# Patient Record
Sex: Female | Born: 1985 | Race: Black or African American | Hispanic: No | Marital: Single | Smoking: Current every day smoker
Health system: Southern US, Community
[De-identification: ages and names within clinical notes are randomized; demographics above are authoritative.]

## PROBLEM LIST (undated history)

## (undated) DIAGNOSIS — N920 Excessive and frequent menstruation with regular cycle: Secondary | ICD-10-CM

## (undated) DIAGNOSIS — I10 Essential (primary) hypertension: Secondary | ICD-10-CM

## (undated) DIAGNOSIS — F329 Major depressive disorder, single episode, unspecified: Secondary | ICD-10-CM

## (undated) DIAGNOSIS — F319 Bipolar disorder, unspecified: Secondary | ICD-10-CM

## (undated) DIAGNOSIS — F419 Anxiety disorder, unspecified: Secondary | ICD-10-CM

## (undated) DIAGNOSIS — D649 Anemia, unspecified: Secondary | ICD-10-CM

## (undated) HISTORY — DX: Bipolar disorder, unspecified: F31.9

## (undated) HISTORY — DX: Major depressive disorder, single episode, unspecified: F32.9

## (undated) HISTORY — DX: Anemia, unspecified: D64.9

## (undated) HISTORY — DX: Essential (primary) hypertension: I10

## (undated) HISTORY — DX: Anxiety disorder, unspecified: F41.9

---

## 2007-11-28 ENCOUNTER — Ambulatory Visit: Payer: Self-pay | Admitting: Internal Medicine

## 2008-07-27 ENCOUNTER — Emergency Department: Payer: Self-pay | Admitting: Emergency Medicine

## 2009-03-13 ENCOUNTER — Emergency Department: Payer: Self-pay | Admitting: Emergency Medicine

## 2009-04-04 ENCOUNTER — Emergency Department: Payer: Self-pay | Admitting: Unknown Physician Specialty

## 2009-04-11 ENCOUNTER — Emergency Department: Payer: Self-pay | Admitting: Emergency Medicine

## 2009-04-28 ENCOUNTER — Emergency Department: Payer: Self-pay | Admitting: Internal Medicine

## 2009-10-02 ENCOUNTER — Inpatient Hospital Stay: Payer: Self-pay | Admitting: Psychiatry

## 2010-01-13 ENCOUNTER — Emergency Department: Payer: Self-pay | Admitting: Emergency Medicine

## 2010-01-17 ENCOUNTER — Emergency Department: Payer: Self-pay

## 2012-04-23 ENCOUNTER — Inpatient Hospital Stay: Payer: Self-pay

## 2012-04-23 LAB — PIH PROFILE
Anion Gap: 10 (ref 7–16)
BUN: 4 mg/dL — ABNORMAL LOW (ref 7–18)
Calcium, Total: 8.6 mg/dL (ref 8.5–10.1)
Co2: 23 mmol/L (ref 21–32)
EGFR (Non-African Amer.): >60
HGB: 12.1 g/dL (ref 12.0–16.0)
MCH: 24.3 pg — ABNORMAL LOW (ref 26.0–34.0)
Osmolality: 279 (ref 275–301)
Platelet: 110 10*3/uL — ABNORMAL LOW (ref 150–440)
Potassium: 3.6 mmol/L (ref 3.5–5.1)
RBC: 4.99 10*6/uL (ref 3.80–5.20)
SGOT(AST): 20 U/L (ref 15–37)
Sodium: 142 mmol/L (ref 136–145)
Uric Acid: 4.1 mg/dL (ref 2.6–6.0)

## 2012-04-23 LAB — PROTEIN / CREATININE RATIO, URINE: Protein/Creat. Ratio: 334 mg/gCREAT — ABNORMAL HIGH (ref 0–200)

## 2012-04-24 LAB — HEMATOCRIT: HCT: 30.6 % — ABNORMAL LOW (ref 35.0–47.0)

## 2013-02-12 ENCOUNTER — Emergency Department: Payer: Self-pay | Admitting: Emergency Medicine

## 2013-02-12 LAB — CBC
HCT: 38 % (ref 35.0–47.0)
HGB: 12.1 g/dL (ref 12.0–16.0)
MCH: 21.9 pg — ABNORMAL LOW (ref 26.0–34.0)
WBC: 6.5 10*3/uL (ref 3.6–11.0)

## 2013-02-12 LAB — URINALYSIS, COMPLETE
Bilirubin,UR: NEGATIVE
Glucose,UR: NEGATIVE mg/dL (ref 0–75)
Leukocyte Esterase: NEGATIVE
Nitrite: NEGATIVE
Ph: 5 (ref 4.5–8.0)
Specific Gravity: 1.026 (ref 1.003–1.030)
Squamous Epithelial: 5
WBC UR: 3 /HPF (ref 0–5)

## 2013-02-12 LAB — HCG, QUANTITATIVE, PREGNANCY: Beta Hcg, Quant.: 155954 m[IU]/mL — ABNORMAL HIGH

## 2013-02-12 LAB — GC/CHLAMYDIA PROBE AMP

## 2013-09-06 ENCOUNTER — Ambulatory Visit: Payer: Self-pay | Admitting: Obstetrics and Gynecology

## 2013-09-06 LAB — CBC WITH DIFFERENTIAL/PLATELET
Basophil #: 0 10*3/uL (ref 0.0–0.1)
Basophil %: 0.5 %
HCT: 34.9 % — ABNORMAL LOW (ref 35.0–47.0)
HGB: 11.2 g/dL — ABNORMAL LOW (ref 12.0–16.0)
Lymphocyte %: 22.9 %
MCHC: 32.2 g/dL (ref 32.0–36.0)
Neutrophil #: 4.9 10*3/uL (ref 1.4–6.5)
Platelet: 121 10*3/uL — ABNORMAL LOW (ref 150–440)
RDW: 14.4 % (ref 11.5–14.5)
WBC: 6.8 10*3/uL (ref 3.6–11.0)

## 2013-09-08 ENCOUNTER — Inpatient Hospital Stay: Payer: Self-pay | Admitting: Obstetrics and Gynecology

## 2014-12-10 ENCOUNTER — Emergency Department: Payer: Self-pay | Admitting: Emergency Medicine

## 2014-12-10 LAB — CBC
HCT: 33.4 % — ABNORMAL LOW (ref 35.0–47.0)
HGB: 10.5 g/dL — ABNORMAL LOW (ref 12.0–16.0)
MCH: 22.6 pg — AB (ref 26.0–34.0)
MCHC: 31.3 g/dL — AB (ref 32.0–36.0)
MCV: 72 fL — ABNORMAL LOW (ref 80–100)
Platelet: 144 10*3/uL — ABNORMAL LOW (ref 150–440)
RBC: 4.63 10*6/uL (ref 3.80–5.20)
RDW: 14.7 % — AB (ref 11.5–14.5)
WBC: 8.1 10*3/uL (ref 3.6–11.0)

## 2014-12-10 LAB — COMPREHENSIVE METABOLIC PANEL
ALT: 11 U/L — AB
AST: 18 U/L
Albumin: 3 g/dL — ABNORMAL LOW
Alkaline Phosphatase: 115 U/L
Anion Gap: 9 (ref 7–16)
BUN: 9 mg/dL
Bilirubin,Total: 0.6 mg/dL
CALCIUM: 8.7 mg/dL — AB
CHLORIDE: 105 mmol/L
Co2: 21 mmol/L — ABNORMAL LOW
Creatinine: 0.54 mg/dL
EGFR (African American): >60
EGFR (Non-African Amer.): >60
GLUCOSE: 121 mg/dL — AB
Potassium: 3.5 mmol/L
Sodium: 135 mmol/L
Total Protein: 7.1 g/dL

## 2014-12-10 LAB — TROPONIN I: Troponin-I: <0.03 ng/mL

## 2014-12-10 LAB — D-DIMER(ARMC): D-DIMER: 1037 ng/mL

## 2015-01-01 NOTE — Op Note (Signed)
PATIENT NAME:  Terri Bruce, Terri Bruce MR#:  [id number] DATE OF BIRTH:  01-18-1986  DATE OF PROCEDURE:  04/23/2012  PREOPERATIVE DIAGNOSIS: Secondary arrest of dilation.   POSTOPERATIVE DIAGNOSIS: Secondary arrest of dilation.   PROCEDURE: Primary LUT cesarean section.   SURGEON: Delsa Sale, MD  ASSISTANT: Burlene Arnt, CNM  ESTIMATED BLOOD LOSS: 1000 mL.   FINDINGS: Baby girl weighing 8 pounds 2 ounces with Apgars 4 and 9.   DESCRIPTION OF PROCEDURE: Patient was taken to the Operating Room and placed in supine position. After adequate spinal and epidural anesthesia was instilled, the patient was prepped and draped in the usual sterile fashion. see next dictaion of complete  (dictation cut off here)  ____________________________ Delsa Sale, MD cck:cms D: 04/26/2012 08:14:29 ET T: 04/26/2012 11:03:41 ET JOB#: [id number]  cc: Delsa Sale, MD, <Dictator> Delsa Sale MD ELECTRONICALLY SIGNED 04/26/2012 20:04

## 2015-01-01 NOTE — Op Note (Signed)
PATIENT NAME:  Terri Bruce, Terri Bruce MR#:  [id number] DATE OF BIRTH:  01-24-1986  DATE OF PROCEDURE:  04/23/2012  PREOPERATIVE DIAGNOSIS: Secondary arrest of dilation.  POSTOPERATIVE DIAGNOSIS: Secondary arrest of dilation.   PROCEDURE: LUT cesarean section   SURGEON: Delsa Sale, MD  ASSISTANT: Tammy Brothers, CNM   ESTIMATED BLOOD LOSS: 1000 mL.  FINDINGS: 8 pounds 2 ounce liveborn female infant, Apgars 4 and 9 with baby secondarily apneic.   DESCRIPTION OF PROCEDURE: Patient was taken to the Operating Room and placed in supine position. After adequate epidural anesthesia was instilled, the patient was prepped and draped in the usual sterile fashion. Timeout was performed. Pfannenstiel skin incision was made approximately two fingerbreadths above the pubic symphysis, carried sharply down to the fascia. Fascia was nicked in the midline and the incision was extended to the fascia. The incision was extended in a superolateral manner with curved Mayo scissors. Kochers were used to grasp the fascia. Fascia muscle bellies were sharply and bluntly dissected off the inferior and the superior rectus muscles defined by incision. Muscle belly midline was identified. Center was entered. Peritoneum was grasped and incision was extended. The bladder blade was placed. Bladder flap was created. Bladder blade was replaced. Uterine incision was made. Infant's head was delivered and found to be  very molded. The infant was bulb suctioned. Remainder of the infant was delivered. Infant was bulb suctioned. Cord was clamped and cut. Infant was handed off to the pediatrician. Cord blood was obtained. Cord was clamped secondarily. Placenta was delivered. Pitocin was started. Uterus was wrapped in a moist laparotomy sponge and the anterior was curetted with a moist lap sponge. Cord gas was obtained as it turns out that the child was secondarily apneic after delivery. Uterine incision was closed with a running locked chromic  suture, secondary running embrocating suture was placed. Bladder flap was tacked back to uterine incision. The belly was cleared of clot. Uterus was placed back into the abdomen. Gutter clots were removed. Kellys were used to grasp the peritoneum and the muscle bellies were approximated with a running Vicryl suture. The On-Q pain pump was then inserted, trocars through the superior rectus fascia. The fascial incision was closed. 4-0 Monocryl was used to close the skin incision. The On-Q pump was primed. Steri-Strips and benzoin were placed on the incision. 4 x 4 and Tegaderm was placed on the abdomen underneath the catheters of the On-Q pain pump. Bandage was placed. Uterus was massaged and found to be firm. Clear urine was noted in the Foley bag and the patient was taken to recovery after having tolerated the procedure well.   ____________________________ Delsa Sale, MD cck:cms D: 04/26/2012 08:21:16 ET T: 04/26/2012 11:07:17 ET JOB#: [id number]  cc: Delsa Sale, MD, <Dictator> Delsa Sale MD ELECTRONICALLY SIGNED 04/26/2012 20:05

## 2015-01-05 NOTE — Op Note (Signed)
PATIENT NAME:  Terri Bruce, Terri Bruce MR#:  [id number] DATE OF BIRTH:  1986/03/12  DATE OF PROCEDURE:  09/13/2013  PREOPERATIVE DIAGNOSES: Repeat cesarean section. Previous C-section.  POSTOPERATIVE DIAGNOSES:   Repeat cesarean section. Previous C-section.  PROCEDURES: Repeat lower uterine segment transverse cesarean section.   SURGEON:  Delsa Sale, M.D.  ASSISTANT: (Dictation Anomaly) .  ESTIMATED BLOOD LOSS: 1000 mL.   FINDINGS: Term liveborn infant with normal uterus, tubes and ovaries except for one large fibroid at the right posterior aspect near the uterocervical junction, approximately 4 cm, and half in the muscle, half in the exterior.   DESCRIPTION OF PROCEDURE: The patient was taken to the Operating Room and placed in supine position. After adequate spinal anesthesia was instilled, the patient was prepped and draped in the usual sterile fashion. Pfannenstiel skin incision was made around her old skin incision. The old incision was cut out as it had become keloid. __the fascia___ was nicked in the midline and carried sharply down to the fascia. The fascia was extended in a superolateral manner with the curved Mayo scissors. Kochers were used to grasp the rectus fascia which was sharply and bluntly dissected off the rectus muscles. Midlines of the muscles were identified and split. Peritoneum was grasped, cut, entered and bladder blade was placed. A bladder flap was created. Uterine incision was made and extended with the surgeon's fingers. Infant's head was found, delivered, infant's mouth was bulb suctioned. Remainder of the infant was delivered. Cord was clamped, cut. The infant was handed off to the awaiting pediatrician. Cord blood was obtained, Pitocin was started, the placenta delivered.  The uterus was exteriorized and wrapped with a moist laparotomy sponge.  The interior of the uterus was curetted. Uterine incision was closed with a running locked (Dictation Anomaly)chromic  suture and  then a running imbricating suture. The bladder was tacked back up to the incision with a 3-0 chromic. The belly was cleared of clots. The uterus was placed back, (Dictation Anomaly)the muscle bellies  were closed and approximated with Vicryl suture. ON-Q pain pump trocars were placed. The catheters were threaded. They are placed in between the muscle and fascia. The fascia was closed with a running Vicryl suture. The pump was primed. Skin clips were placed on the skin edges. Bandage was placed. ON-Q pain pump catheters were attached to the patient's abdomen with 4 x 4's. Uterus was then assessed. (Dictation Anomaly)and was a firm uterus. The patient was then taken to recovery with clear urine noted in the Foley bag.   ____________________________ Delsa Sale, MD cck:sg D: 09/13/2013 09:38:24 ET T: 09/13/2013 10:45:50 ET JOB#: [id number]  cc: Delsa Sale, MD, <Dictator> Delsa Sale MD ELECTRONICALLY SIGNED 09/14/2013 14:39

## 2015-01-07 ENCOUNTER — Inpatient Hospital Stay
Admit: 2015-01-07 | Discharge: 2015-01-12 | Disposition: A | Discharge status: Home / Self Care | Payer: Self-pay | Source: Ambulatory Visit | Attending: Obstetrics and Gynecology | Admitting: Obstetrics and Gynecology

## 2015-01-07 LAB — CBC WITH DIFFERENTIAL/PLATELET
BASOS ABS: 0 10*3/uL (ref 0.0–0.1)
Basophil %: 0.3 %
Eosinophil #: 0 10*3/uL (ref 0.0–0.7)
Eosinophil %: 0.6 %
HCT: 34.1 % — ABNORMAL LOW (ref 35.0–47.0)
HGB: 10.8 g/dL — ABNORMAL LOW (ref 12.0–16.0)
LYMPHS PCT: 16 %
Lymphocyte #: 1.3 10*3/uL (ref 1.0–3.6)
MCH: 22.9 pg — ABNORMAL LOW (ref 26.0–34.0)
MCHC: 31.8 g/dL — AB (ref 32.0–36.0)
MCV: 72 fL — ABNORMAL LOW (ref 80–100)
MONOS PCT: 10.1 %
Monocyte #: 0.8 x10 3/mm (ref 0.2–0.9)
NEUTROS PCT: 73 %
Neutrophil #: 5.9 10*3/uL (ref 1.4–6.5)
PLATELETS: 132 10*3/uL — AB (ref 150–440)
RBC: 4.72 10*6/uL (ref 3.80–5.20)
RDW: 16 % — ABNORMAL HIGH (ref 11.5–14.5)
WBC: 8.1 10*3/uL (ref 3.6–11.0)

## 2015-01-07 LAB — COMPREHENSIVE METABOLIC PANEL
ANION GAP: 8 (ref 7–16)
AST: 21 U/L
Albumin: 2.8 g/dL — ABNORMAL LOW
Alkaline Phosphatase: 122 U/L
BUN: 7 mg/dL
Bilirubin,Total: 0.3 mg/dL
CALCIUM: 8.6 mg/dL — AB
CO2: 22 mmol/L
Chloride: 107 mmol/L
Creatinine: 0.49 mg/dL
EGFR (Non-African Amer.): >60
GLUCOSE: 78 mg/dL
Potassium: 3.3 mmol/L — ABNORMAL LOW
SGPT (ALT): 15 U/L
SODIUM: 137 mmol/L
Total Protein: 6.6 g/dL

## 2015-01-07 LAB — PROTEIN / CREATININE RATIO, URINE
CREATININE, URINE RANDOM: 114 mg/dL (ref 30–125)
PROTEIN/CREAT. RATIO: 175 mg/g{creat} (ref 0–200)
Protein, Urine: 20 mg/dL (ref 0–9)

## 2015-01-08 LAB — CBC WITH DIFFERENTIAL/PLATELET
BASOS PCT: 0.3 %
BASOS PCT: 0.3 %
BASOS PCT: 0.4 %
Basophil #: 0 10*3/uL (ref 0.0–0.1)
Basophil #: 0 10*3/uL (ref 0.0–0.1)
Basophil #: 0 10*3/uL (ref 0.0–0.1)
EOS ABS: 0 10*3/uL (ref 0.0–0.7)
EOS ABS: 0 10*3/uL (ref 0.0–0.7)
EOS PCT: 0.8 %
EOS PCT: 0.5 %
Eosinophil #: 0.1 10*3/uL (ref 0.0–0.7)
Eosinophil %: 0.2 %
HCT: 33.6 % — AB (ref 35.0–47.0)
HCT: 35.5 % (ref 35.0–47.0)
HCT: 36.7 % (ref 35.0–47.0)
HGB: 10.2 g/dL — AB (ref 12.0–16.0)
HGB: 11.1 g/dL — AB (ref 12.0–16.0)
HGB: 11.4 g/dL — ABNORMAL LOW (ref 12.0–16.0)
LYMPHS ABS: 1 10*3/uL (ref 1.0–3.6)
Lymphocyte #: 1.4 10*3/uL (ref 1.0–3.6)
Lymphocyte #: 1 10*3/uL (ref 1.0–3.6)
Lymphocyte %: 18.8 %
Lymphocyte %: 14.2 %
Lymphocyte %: 9 %
MCH: 22.4 pg — ABNORMAL LOW (ref 26.0–34.0)
MCH: 23.1 pg — ABNORMAL LOW (ref 26.0–34.0)
MCH: 22.9 pg — AB (ref 26.0–34.0)
MCHC: 30.4 g/dL — AB (ref 32.0–36.0)
MCHC: 31.4 g/dL — AB (ref 32.0–36.0)
MCHC: 31 g/dL — ABNORMAL LOW (ref 32.0–36.0)
MCV: 74 fL — ABNORMAL LOW (ref 80–100)
MCV: 74 fL — ABNORMAL LOW (ref 80–100)
MCV: 74 fL — AB (ref 80–100)
MONO ABS: 0.8 x10 3/mm (ref 0.2–0.9)
MONO ABS: 0.6 x10 3/mm (ref 0.2–0.9)
Monocyte #: 0.4 x10 3/mm (ref 0.2–0.9)
Monocyte %: 11.2 %
Monocyte %: 5.2 %
Monocyte %: 5.6 %
NEUTROS ABS: 9 10*3/uL — AB (ref 1.4–6.5)
NEUTROS PCT: 79.8 %
Neutrophil #: 5 10*3/uL (ref 1.4–6.5)
Neutrophil #: 5.5 10*3/uL (ref 1.4–6.5)
Neutrophil %: 68.9 %
Neutrophil %: 84.8 %
PLATELETS: 122 10*3/uL — AB (ref 150–440)
Platelet: 127 10*3/uL — ABNORMAL LOW (ref 150–440)
Platelet: 134 10*3/uL — ABNORMAL LOW (ref 150–440)
RBC: 4.56 10*6/uL (ref 3.80–5.20)
RBC: 4.83 10*6/uL (ref 3.80–5.20)
RBC: 4.98 10*6/uL (ref 3.80–5.20)
RDW: 15.7 % — ABNORMAL HIGH (ref 11.5–14.5)
RDW: 16.2 % — ABNORMAL HIGH (ref 11.5–14.5)
RDW: 16.5 % — AB (ref 11.5–14.5)
WBC: 7.3 10*3/uL (ref 3.6–11.0)
WBC: 6.9 10*3/uL (ref 3.6–11.0)
WBC: 10.6 10*3/uL (ref 3.6–11.0)

## 2015-01-08 LAB — APTT: ACTIVATED PTT: 25.7 s (ref 23.6–35.9)

## 2015-01-08 LAB — PROTIME-INR
INR: 1
PROTHROMBIN TIME: 13.5 s

## 2015-01-08 LAB — FIBRINOGEN: Fibrinogen: 472 mg/dL — ABNORMAL HIGH (ref 210–470)

## 2015-01-09 LAB — CBC WITH DIFFERENTIAL/PLATELET
BASOS ABS: 0 10*3/uL (ref 0.0–0.1)
Basophil #: 0 10*3/uL (ref 0.0–0.1)
Basophil %: 0.3 %
Basophil %: 0.4 %
Eosinophil #: 0 10*3/uL (ref 0.0–0.7)
Eosinophil #: 0.1 10*3/uL (ref 0.0–0.7)
Eosinophil %: 0.2 %
Eosinophil %: 0.7 %
HCT: 32.5 % — ABNORMAL LOW (ref 35.0–47.0)
HCT: 31.8 % — ABNORMAL LOW (ref 35.0–47.0)
HGB: 10.1 g/dL — ABNORMAL LOW (ref 12.0–16.0)
HGB: 9.8 g/dL — AB (ref 12.0–16.0)
LYMPHS ABS: 1.1 10*3/uL (ref 1.0–3.6)
Lymphocyte #: 1 10*3/uL (ref 1.0–3.6)
Lymphocyte %: 10.1 %
Lymphocyte %: 11.4 %
MCH: 22.7 pg — ABNORMAL LOW (ref 26.0–34.0)
MCH: 22.7 pg — AB (ref 26.0–34.0)
MCHC: 30.9 g/dL — ABNORMAL LOW (ref 32.0–36.0)
MCHC: 31.1 g/dL — AB (ref 32.0–36.0)
MCV: 73 fL — ABNORMAL LOW (ref 80–100)
MCV: 74 fL — AB (ref 80–100)
MONO ABS: 0.6 x10 3/mm (ref 0.2–0.9)
Monocyte #: 0.8 x10 3/mm (ref 0.2–0.9)
Monocyte %: 6.3 %
Monocyte %: 8.5 %
NEUTROS ABS: 7.7 10*3/uL — AB (ref 1.4–6.5)
NEUTROS PCT: 83.1 %
Neutrophil #: 8.6 10*3/uL — ABNORMAL HIGH (ref 1.4–6.5)
Neutrophil %: 79 %
PLATELETS: 120 10*3/uL — AB (ref 150–440)
Platelet: 111 10*3/uL — ABNORMAL LOW (ref 150–440)
RBC: 4.44 10*6/uL (ref 3.80–5.20)
RBC: 4.32 10*6/uL (ref 3.80–5.20)
RDW: 16.2 % — AB (ref 11.5–14.5)
RDW: 16.6 % — AB (ref 11.5–14.5)
WBC: 10.3 10*3/uL (ref 3.6–11.0)
WBC: 9.8 10*3/uL (ref 3.6–11.0)

## 2015-01-10 LAB — SURGICAL PATHOLOGY

## 2015-01-12 MED ORDER — QUETIAPINE FUMARATE 100 MG PO TABS: 100.0000 mg | ORAL_TABLET | Freq: Every day | ORAL | Status: DC

## 2015-01-12 MED ORDER — BENZOCAINE-MENTHOL 20-0.5 % EX AERO
1.0000 "application " | INHALATION_SPRAY | Freq: Four times a day (QID) | CUTANEOUS | Status: DC | PRN
  Filled 2015-01-12: qty 56

## 2015-01-12 MED ORDER — GUAIFENESIN 100 MG/5ML PO SYRP
300.0000 mg | ORAL_SOLUTION | ORAL | Status: DC | PRN
  Filled 2015-01-12: qty 15

## 2015-01-12 MED ORDER — SIMETHICONE 80 MG PO CHEW: 80.0000 mg | CHEWABLE_TABLET | Freq: Three times a day (TID) | ORAL | Status: DC

## 2015-01-12 MED ORDER — OXYCODONE HCL 5 MG PO TABS: 5.0000 mg | ORAL_TABLET | Freq: Four times a day (QID) | ORAL | Status: DC | PRN

## 2015-01-12 MED ORDER — FERROUS SULFATE 325 (65 FE) MG PO TABS: 325.0000 mg | ORAL_TABLET | Freq: Two times a day (BID) | ORAL | Status: DC

## 2015-01-12 MED ORDER — PRENATAL MULTIVITAMIN CH
1.0000 | ORAL_TABLET | Freq: Every day | ORAL | Status: DC
  Filled 2015-01-12: qty 1

## 2015-01-12 MED ORDER — BISACODYL 10 MG RE SUPP: 10.0000 mg | Freq: Four times a day (QID) | RECTAL | Status: DC | PRN

## 2015-01-12 MED ORDER — QUETIAPINE FUMARATE ER 50 MG PO TB24: 100.0000 mg | ORAL_TABLET | Freq: Every day | ORAL | Status: DC

## 2015-01-12 MED ORDER — TEMAZEPAM 15 MG PO CAPS: 30.0000 mg | ORAL_CAPSULE | Freq: Every evening | ORAL | Status: DC | PRN

## 2015-01-12 MED ORDER — ALUM & MAG HYDROXIDE-SIMETH 200-200-20 MG/5ML PO SUSP: 30.0000 mL | ORAL | Status: DC | PRN

## 2015-01-12 MED ORDER — IBUPROFEN 600 MG PO TABS: 600.0000 mg | ORAL_TABLET | Freq: Four times a day (QID) | ORAL | Status: DC | PRN

## 2015-01-12 MED ORDER — PSEUDOEPHEDRINE HCL 30 MG PO TABS: 30.0000 mg | ORAL_TABLET | Freq: Four times a day (QID) | ORAL | Status: DC | PRN

## 2015-01-12 MED ORDER — DIPHENHYDRAMINE HCL 25 MG PO CAPS: 25.0000 mg | ORAL_CAPSULE | ORAL | Status: DC | PRN

## 2015-01-12 MED ORDER — LABETALOL HCL 200 MG PO TABS: 300.0000 mg | ORAL_TABLET | Freq: Two times a day (BID) | ORAL | Status: DC

## 2015-01-12 MED ORDER — WITCH HAZEL-GLYCERIN EX PADS: MEDICATED_PAD | CUTANEOUS | Status: DC | PRN

## 2015-01-12 MED ORDER — DOCUSATE SODIUM 100 MG PO CAPS: 100.0000 mg | ORAL_CAPSULE | Freq: Two times a day (BID) | ORAL | Status: DC

## 2015-01-12 MED ORDER — PROMETHAZINE HCL 25 MG PO TABS: 25.0000 mg | ORAL_TABLET | ORAL | Status: DC | PRN

## 2015-01-13 NOTE — Op Note (Addendum)
PATIENT NAME:  Terri Bruce, Terri Bruce MR#:  [id number] DATE OF BIRTH:  [birth year]  DATE OF PROCEDURE:  01/08/2015.  PREOPERATIVE DIAGNOSES:   1. Intrauterine pregnancy at 38 weeks and 2 days.  2. Worsening chronic hypertension.  3. New-onset gestational thrombocytopenia.  4. History of cesarean section x 2.  5. Desire for permanent sterilization.   POSTOPERATIVE DIAGNOSES:   1. Intrauterine pregnancy at 38 weeks and 2 days.  2. Worsening chronic hypertension.  3. New-onset gestational thrombocytopenia.  4. History of cesarean section x 2.  5. Desire for permanent sterilization.  6. Fibroid uterus.   PROCEDURE:  Repeat low transverse cesarean section via Pfannenstiel skin incision with double-layer uterine closure and bilateral tubal ligation via Parkland method.   SURGEON:  Aletha Halim, MD.     ASSISTANT:  Hoyt Koch MD.   ANESTHESIA: Spinal   ESTIMATED BLOOD LOSS: 900 mL.   URINE OUTPUT:  150 mL.   INTRAVENOUS FLUID:  1500 mL of crystalloid.   VENOUS THROMBOEMBOLISM PROPHYLAXIS:  SCDs applied to bilateral lower extremities.   ANTIBIOTICS:  2 grams of Ancef given preoperatively.   COMPLICATIONS:  Right rectus muscle bleeder necessitating re-entry and general ooziness necessitating return to the abdomen x 2 prior to full fascial closure.   FINDINGS:  Mild-to-moderate adhesions and scarring was noted down to the layer of the peritoneum but there were no intra-abdominal adhesions noted.  Normal uterus, tubes, and ovaries bilaterally.  Female infant, cephalic presentation.  Clear amniotic fluid.  Birth weight 6 pounds 3 ounces.  Apgars of 9 and 9.  Loose nuchal cord easily reduced. Normal fallopian tubes traced to the fimbria and removal of normal-appearing mid segment bilaterally.   Right rectus muscle bleeder and general ooziness at the peritoneum.   Approximately 4-5 cm fibroid that appeared to be subserosal was noted on the posterior right lower uterine segment.      SPECIMENS:  Portions of bilateral fallopian tubes to pathology.    DESCRIPTION OF PROCEDURE:  The patient was taken to the operating room where anesthesia was administered.  She was then prepped and draped in normal sterile fashion in a dorsal supine position with a leftward tilt.  A Pfannenstiel skin incision was made with a scalpel and carried down to the underlying fascia and nicked in the midline.  The fascia was then dissected off the rectus muscle using blunt and sharp dissection, and this was done superiorly and inferiorly.  Next, the Claiborne Billings was used to dissect the peritoneum and enter the abdomen and was spread bluntly as well as with cautery.  The bladder blade was then inserted and the vesicouterine peritoneum was then tented up and a bladder flap was made with the Metzenbaum scissors and the bladder blade then reinserted.  A low transverse hysterotomy was made with a scalpel and carried down to the amniotic sac and punctured with the Allis clamp.  Next, the infant was then delivered atraumatically, the loose nuchal cord reduced x 1, and the infant delivered without issue.  The cord was clamped x2 and handed to the awaiting pediatricians.  The placenta was then gradually expressed from the uterus, and the uterus was then exteriorized and cleared of all clots and debris.  The hysterotomy was then closed in a running suture of 1-0Monocryl in a running locking fashion, and a second imbricating layer was then done also with Monocryl.  The hysterotomy was then packed with sponges, and the Parkland tubal ligation was then done bilaterally without complication.  The tubal  sites were then inspected and noted to be hemostatic.  The hysterotomy was then inspected and noted to be hemostatic, and the uterus was then returned to the patient's abdomen.  The bilateral tubal sites were then reinspected and noticed to be intact and hemostatic.  The peritoneum was then closed with 3-0 Vicryl and the left part of the  fascia was then closed with 0 Vicryl and the right part of the fascia was then also closed with 0 Vicryl, but prior to complete closure of this area, oozing was then noted, so the fascia was then reopened and a right rectus muscle bleeder was noted and a figure-of-eight suture of #1 chromic was then placed and there appeared to be no more bleeding.  The fascia was then closed again but prior to complete closure, general ooziness and bleeding was felt to be appearing, so the fascia was then reopened in the area.  The right rectus muscle was felt to be hemostatic.  The tubal sites were then reinspected and the hysterotomy was then reinspected and no evidence of bleeding, but the right rectus muscle was slightly oozy and bleeding, so another stitch of chromic was then placed for excellent hemostasis.  There was still felt to be some general oozing but there was no active bleeding from all the operative sites, so the decision was made to close the patient's fascia with 0 Vicryl bilaterally and the skin with staples and to get a CBC, PT, PTT, INR, and fibrinogen in the PACU and trend her CBCs.  Sponge, lap, needle, and instrument counts were correct x 2, and the patient was taken to the recovery room, awake, alert, breathing independently, in stable condition.     ____________________________ Aletha Halim, MD cp:kc D: 01/08/2015 15:43:21 ET T: 01/08/2015 19:33:34 ET JOB#: [id number]  cc: Aletha Halim, MD, <Dictator> Aletha Halim MD ELECTRONICALLY SIGNED 01/12/2015 19:51

## 2015-01-22 NOTE — H&P (Signed)
L&D Evaluation:  History Expanded:   HPI 29 yo G1 presents at 39 weeks, EDD of 04/30/12 per LMP & 13 week [place]. Pt presents with c/o SROM at 0600 this am with clear fluid, contractions started shortly after. +FM. PNC at [facility] notable for early entry to care. Per pt, EFW 3 weeks ago was 6 lbs.    Blood Type (Maternal) O positive    Group B Strep Results Maternal (Result >5wks must be treated as unknown) negative    Maternal HIV Negative    Maternal Syphilis Ab Nonreactive    Maternal Varicella Immune    Rubella Results (Maternal) immune    Maternal T-Dap Unknown    Patient's Medical History Bipolar, Depression    Patient's Surgical History none    Medications Pre Natal Vitamins  Zoloft 50 mg daily    Allergies NKDA    Social History none  former tobacco    Family History Non-Contributory   ROS:   ROS see HPI   Exam:   Vital Signs BP >140/90  labile BP's    Urine Protein not completed    General no apparent distress    Mental Status clear    Chest clear    Heart no murmur/gallop/rubs    Abdomen gravid, tender with contractions    Estimated Fetal Weight EFW 7.5 - 8 lbs    Fetal Position vertex    Edema trace pedal edema    Reflexes 2+    Pelvic 1 cm per RN    Mebranes Ruptured    Description clear    FHT normal rate with no decels, BL 130-140, min-mod variability, + accels, no decels    Ucx regular, q 2-6 min   Impression:   Impression early labor, SROM   Plan:   Plan PIH panel    Comments Admit for delivery IV pain medication Epidural when appropriate   Electronic Signatures: Ander Purpura (CNM)  (Signed 10-Aug-13 08:49)  Authored: L&D Evaluation   Last Updated: 10-Aug-13 08:49 by Ander Purpura (CNM)

## 2015-01-22 NOTE — H&P (Signed)
L&D Evaluation:  History:  HPI 29 year old G3 P72 with EDC=01/20/2015 by LMP=04/15/2014, with hx of CS x2 presents at 38.1 weeks from office with BP 160/110 for PIH evaluation. Hx of CHTN and was begun on labetalol at 28 weeks. Patient taking 400 mgm at hs rather than 200 mgm BID. She denies headache, SOB, CP, RUQ pain or any visial chages. She has noticed more edema of her hands and face with carpel tunnel like sx in right hand. 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 LABS: O POS/RI/VNI/ GBS negative. Will be bottle feeding. Desires BTL with CS and has signed 30 day papers.   Presents with elevated BP   Patient's Medical History CHTN/ Bipolar/ Depression/   Patient's Surgical History CS x2 in 2013 for FTP and Dec 2014 at 14 weeks for  gestational HTN.   Medications Pre Natal Vitamins  labetalol 400 mgm qhs, Seroquel 100 immediate release and 100 mgm XR at hs, Clonopam 1-2 mgm daily prn anxiety, Fioricet prn headache   Allergies NKDA   Social History none   Family History Non-Contributory   ROS:  ROS see HPI   Exam:  Vital Signs 182/122 and 160/122 on arrival, given labetalol 400 mgm po on arrival   Urine Protein negative dipstick, in office   General no apparent distress   Mental Status clear   Chest clear   Heart normal sinus rhythm, no murmur/gallop/rubs   Abdomen gravid, non-tender   Estimated Fetal Weight Average for gestational age   Fetal Position cephalic   Edema no edema  hand and face edema   Reflexes 2+  antecubital   Mebranes Intact   FHT normal rate with no decels, 145 with accels to 170s   Skin dry   Impression:  Impression IUP at 38.1 weeks with CHTN and worsening BPs. R/O  superimposed  preeclampsia. Prior CS x2. Reactive FHR.   Plan:  Plan EFM/NST, monitor BP, Apresoline 5 mgm IV was administered after IV begun. NPO in case we proceed with CS today.  PIH panel, RPR, T&S drawn. AFI   Comments Pertinent labs: P/C ratio=175 mgm, BUN=7, cr=0.49, plt=132K. LFTs WNL.  After apresoline and turning to her side BP 145/82, 142/89, 143/85, 153/100, 139/95. FHTs remains reactive. AFI=4.6cm + 3.4cm=8.0cm. Vtx presentation. Discussed POM with DR Pickens-please see his note.   Electronic Signatures: Terri Bruce (CNM)  (Signed 25-Apr-16 16:09)  Authored: L&D Evaluation   Last Updated: 25-Apr-16 16:09 by Terri Bruce (CNM)

## 2016-06-11 IMAGING — NM NM LUNG PERFUSION SCAN
1 series · 8 of 8 positions shown · non-contrast
Comparison: Chest radiograph 12/10/2014

CLINICAL DATA: No evidence of or pulmonary embolism.

EXAM:
NUCLEAR MEDICINE PERFUSION LUNG SCAN
TECHNIQUE: Perfusion images were obtained in multiple projections after
intravenous injection of radiopharmaceutical.
RADIOPHARMACEUTICALS:  1.5 mCi 3c-EEm MAA

[Series 1000: lung perfusion · 1.65mm/px · 4 acquisitions, 8 frames shown]
[im 1/4]
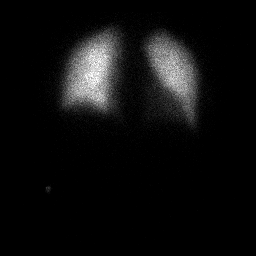
[im 1/4]
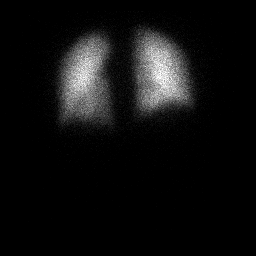
[im 2/4]
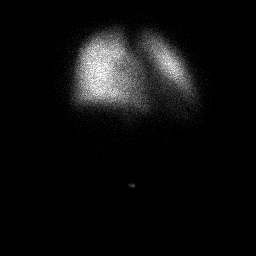
[im 2/4]
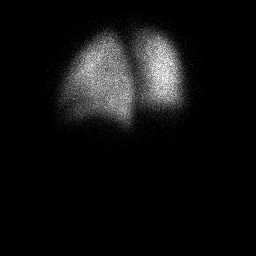
[im 3/4]
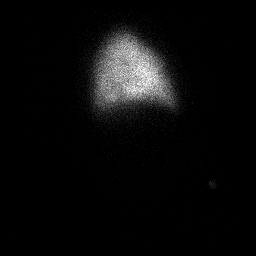
[im 3/4]
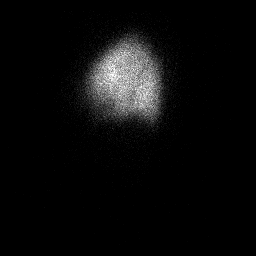
[im 4/4]
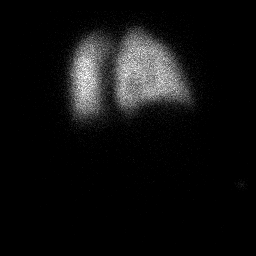
[im 4/4]
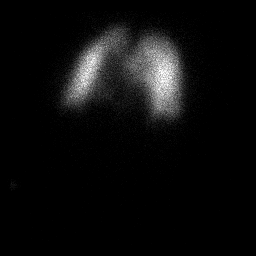

[8 of 8 positions shown; findings below may reference images not displayed]

FINDINGS: There is uniform perfusion to the left and right lung. No
wedge-shaped peripheral perfusion defects to suggest acute pulmonary
embolism.
IMPRESSION: No evidence of acute pulmonary embolism.

## 2017-01-13 DIAGNOSIS — D259 Leiomyoma of uterus, unspecified: Secondary | ICD-10-CM | POA: Diagnosis not present

## 2017-01-18 ENCOUNTER — Inpatient Hospital Stay: Payer: Medicaid Other | Attending: Oncology | Admitting: Oncology

## 2017-01-18 ENCOUNTER — Inpatient Hospital Stay: Payer: Medicaid Other

## 2017-01-18 ENCOUNTER — Encounter: Payer: Self-pay | Admitting: Oncology

## 2017-01-18 VITALS — BP 150/94 | HR 75 | Temp 98.3°F | Resp 18

## 2017-01-18 DIAGNOSIS — N920 Excessive and frequent menstruation with regular cycle: Secondary | ICD-10-CM | POA: Diagnosis not present

## 2017-01-18 DIAGNOSIS — Z79899 Other long term (current) drug therapy: Secondary | ICD-10-CM | POA: Diagnosis not present

## 2017-01-18 DIAGNOSIS — D259 Leiomyoma of uterus, unspecified: Secondary | ICD-10-CM | POA: Insufficient documentation

## 2017-01-18 DIAGNOSIS — F17210 Nicotine dependence, cigarettes, uncomplicated: Secondary | ICD-10-CM

## 2017-01-18 DIAGNOSIS — D509 Iron deficiency anemia, unspecified: Secondary | ICD-10-CM | POA: Diagnosis not present

## 2017-01-18 DIAGNOSIS — K5900 Constipation, unspecified: Secondary | ICD-10-CM | POA: Insufficient documentation

## 2017-01-18 DIAGNOSIS — D500 Iron deficiency anemia secondary to blood loss (chronic): Secondary | ICD-10-CM

## 2017-01-18 DIAGNOSIS — Z9851 Tubal ligation status: Secondary | ICD-10-CM | POA: Diagnosis not present

## 2017-01-18 DIAGNOSIS — D563 Thalassemia minor: Secondary | ICD-10-CM | POA: Insufficient documentation

## 2017-01-18 DIAGNOSIS — I10 Essential (primary) hypertension: Secondary | ICD-10-CM | POA: Insufficient documentation

## 2017-01-18 DIAGNOSIS — D569 Thalassemia, unspecified: Secondary | ICD-10-CM | POA: Insufficient documentation

## 2017-01-18 LAB — IRON AND TIBC
IRON: 17 ug/dL — AB (ref 28–170)
SATURATION RATIOS: 3 % — AB (ref 10.4–31.8)
TIBC: 569 ug/dL — ABNORMAL HIGH (ref 250–450)
UIBC: 552 ug/dL

## 2017-01-18 LAB — CBC WITH DIFFERENTIAL/PLATELET
Basophils Absolute: 0 10*3/uL (ref 0–0.1)
Basophils Relative: 1 %
EOS ABS: 0.1 10*3/uL (ref 0–0.7)
EOS PCT: 1 %
HCT: 26 % — ABNORMAL LOW (ref 35.0–47.0)
HEMOGLOBIN: 7.5 g/dL — AB (ref 12.0–16.0)
LYMPHS ABS: 2.1 10*3/uL (ref 1.0–3.6)
Lymphocytes Relative: 44 %
MCH: 15.8 pg — AB (ref 26.0–34.0)
MCHC: 28.9 g/dL — ABNORMAL LOW (ref 32.0–36.0)
MCV: 54.8 fL — ABNORMAL LOW (ref 80.0–100.0)
Monocytes Absolute: 0.3 10*3/uL (ref 0.2–0.9)
Monocytes Relative: 6 %
NEUTROS PCT: 48 %
Neutro Abs: 2.3 10*3/uL (ref 1.4–6.5)
PLATELETS: 250 10*3/uL (ref 150–440)
RBC: 4.74 MIL/uL (ref 3.80–5.20)
RDW: 20.7 % — ABNORMAL HIGH (ref 11.5–14.5)
WBC: 4.7 10*3/uL (ref 3.6–11.0)

## 2017-01-18 LAB — RETICULOCYTES
RBC.: 4.79 MIL/uL (ref 3.80–5.20)
RETIC CT PCT: 1.3 % (ref 0.4–3.1)
Retic Count, Absolute: 62.3 10*3/uL (ref 19.0–183.0)

## 2017-01-18 LAB — FOLATE: Folate: 29 ng/mL (ref >5.9)

## 2017-01-18 LAB — VITAMIN B12: Vitamin B-12: 566 pg/mL (ref 180–914)

## 2017-01-18 LAB — FERRITIN: FERRITIN: 3 ng/mL — AB (ref 11–307)

## 2017-01-18 MED ORDER — SODIUM CHLORIDE 0.9 % IV SOLN
Freq: Once | INTRAVENOUS | Status: AC
  Administered 2017-01-18: 13:00:00 via INTRAVENOUS
  Filled 2017-01-18: qty 1000

## 2017-01-18 MED ORDER — SODIUM CHLORIDE 0.9 % IV SOLN
510.0000 mg | Freq: Once | INTRAVENOUS | Status: AC
  Administered 2017-01-18: 510 mg via INTRAVENOUS
  Filled 2017-01-18: qty 17

## 2017-01-18 NOTE — Progress Notes (Signed)
Hematology/Oncology Consult note [facility] Telephone:([phone] [phone]  Patient Care Team: Remi Haggard, FNP as PCP - General (Family Medicine)   Name of the patient: Terri Bruce  [id number]  [birth year]    Reason for referral- iron deficiency anemia   Referring physician- Threasa Alpha FNP  Date of visit: 01/18/17   History of presenting illness- patient is a 31 year old African-American female with a history of beta thalassemia trait. She has had 3 prior pregnancy in the last pregnancy was in 2016. She has had tubal ligation done since then. She has been referred to [place] for iron deficiency anemia. CBC from April 2017 showed white count of 4.6, H&H of 10.8/35.9 with the MCV is 69.2 and a platelet count of 219. CMP at that time was within normal limits. Most recent CBC from 12/30/2016 revealed white count of 4.7, H&H of 7.4/28 with MCV of 59 and a platelet count of 182. Patient reports having heavy menstrual cycles which last for 8 days. The first 4 days are particularly heavy. She also reports having history of fibroids. She has tried birth control in the past and it did not help. She also reports having daily headaches and takes one tablet of ibuprofen on a daily basis. He reports it fatigue. Denies other complaints. Denies any gum bleeds, nosebleeds, blood in sputum stool or urine. She has not had any significant bleeding complications with prior surgeries.  ECOG PS- 0  Pain scale- 0   Review of systems- Review of Systems  Constitutional: Positive for malaise/fatigue. Negative for chills, fever and weight loss.  HENT: Negative for congestion, ear discharge and nosebleeds.   Eyes: Negative for blurred vision.  Respiratory: Negative for cough, hemoptysis, sputum production, shortness of breath and wheezing.   Cardiovascular: Negative for chest pain, palpitations, orthopnea and claudication.  Gastrointestinal: Negative for abdominal pain,  blood in stool, constipation, diarrhea, heartburn, melena, nausea and vomiting.  Genitourinary: Negative for dysuria, flank pain, frequency, hematuria and urgency.  Musculoskeletal: Negative for back pain, joint pain and myalgias.  Skin: Negative for rash.  Neurological: Negative for dizziness, tingling, focal weakness, seizures, weakness and headaches.  Endo/Heme/Allergies: Does not bruise/bleed easily.  Psychiatric/Behavioral: Negative for depression and suicidal ideas. The patient does not have insomnia.     No Known Allergies  Patient Active Problem List   Diagnosis Date Noted  . Hypertension 01/18/2017  . Menorrhagia 01/18/2017  . Thalassemia 01/18/2017     Past Medical History:  Diagnosis Date  . Anemia   . Hypertension      History reviewed. No pertinent surgical history.  Social History   Social History  . Marital status: Single    Spouse name: N/A  . Number of children: N/A  . Years of education: N/A   Occupational History  . Not on file.   Social History Main Topics  . Smoking status: Current Every Day Smoker    Packs/day: 0.25    Years: 15.00    Types: Cigarettes  . Smokeless tobacco: Never Used  . Alcohol use Yes  . Drug use: No  . Sexual activity: Not on file   Other Topics Concern  . Not on file   Social History Narrative  . No narrative on file     Family History  Problem Relation Age of Onset  . Cancer Mother 3    leukemia  . Hypertension Mother   . Hypertension Maternal Aunt   . Hypertension Maternal Uncle   . Cancer Paternal  Uncle     Prostate  . Diabetes Maternal Grandfather   . Cancer Paternal Grandfather     colon     Current Outpatient Prescriptions:  .  Cholecalciferol (VITAMIN D3) 5000 units TABS, Take by mouth., Disp: , Rfl:  .  diphenhydrAMINE (BENADRYL) 25 MG tablet, Take nightly as needed for stiffness (dystonia), Disp: , Rfl:  .  hydrochlorothiazide (HYDRODIURIL) 25 MG tablet, Take 25 mg by mouth., Disp: , Rfl:    .  lisinopril (PRINIVIL,ZESTRIL) 40 MG tablet, Take by mouth., Disp: , Rfl:    Physical exam:  Vitals:   01/18/17 1159  BP: (!) 139/98  Pulse: (!) 105  Resp: 20  Temp: 98.7 F (37.1 C)  TempSrc: Tympanic  Weight: 179 lb 3.7 oz (81.3 kg)  Height: 5\' 1"  (1.549 m)   Physical Exam  Constitutional: She is oriented to person, place, and time and well-developed, well-nourished, and in no distress.  HENT:  Head: Normocephalic and atraumatic.  Eyes: EOM are normal. Pupils are equal, round, and reactive to light.  Neck: Normal range of motion.  Cardiovascular: Normal rate, regular rhythm and normal heart sounds.   Pulmonary/Chest: Effort normal and breath sounds normal.  Abdominal: Soft. Bowel sounds are normal.  Neurological: She is alert and oriented to person, place, and time.  Skin: Skin is warm and dry.       CMP Latest Ref Rng & Units 01/07/2015  Glucose mg/dL 78  BUN mg/dL 7  Creatinine mg/dL 0.49  Sodium mmol/L 137  Potassium mmol/L 3.3(L)  Chloride mmol/L 107  CO2 mmol/L 22  Calcium mg/dL 8.6(L)  Total Protein g/dL 6.6  Total Bilirubin mg/dL 0.3  Alkaline Phos U/L 122  AST U/L 21  ALT U/L 15   CBC Latest Ref Rng & Units 01/09/2015  WBC 3.6 - 11.0 x10 3/mm 3 9.8  Hemoglobin 12.0 - 16.0 g/dL 9.8(L)  Hematocrit 35.0 - 47.0 % 31.8(L)  Platelets 150 - 440 x10 3/mm 3 111(L)    Assessment and plan- Patient is a 31 y.o. female referred to [place] for iron deficiency anemia likely due to menorrhagia  Patient probably has some baseline microcytic anemia due to beta thalassemia trait. Her microcytic anemia is acutely worse and she is likely iron deficient due to menorrhagia. Today I will obtain cbc, iron studies, b12, folate and reticulocyte count. Patient has had problems of severe constipation with oral irona nd I will make arrangements for IV iron. I will plan to give her 2 doses of weekly feraheme 510 mg and repeat levels in 2 months  Obtain hemoglobinopathy evaluation from  [facility] side ob done in 2013. Will make referral to them for management of her menorrhagia. Patient is interested in getting hysterectomy.  I will see her back in 2 months. I did offer GI evaluation for EGD to r/o gastritis given frequent NSAID use. She would like to hold off on that for now   Thank you for this kind referral and the opportunity to participate in the care of this patient   Visit Diagnosis 1. Iron deficiency anemia, unspecified iron deficiency anemia type     Dr. Randa Evens, MD, MPH [facility] at [facility] Pager- [phone] 01/18/2017 12:45 PM

## 2017-01-18 NOTE — Patient Instructions (Signed)

## 2017-01-18 NOTE — Progress Notes (Signed)
Patient here for initial visit. She states she has had a headache for the oast several weeks.

## 2017-01-21 ENCOUNTER — Encounter: Payer: Self-pay | Admitting: Family Medicine

## 2017-01-22 ENCOUNTER — Other Ambulatory Visit: Payer: Self-pay | Admitting: *Deleted

## 2017-01-22 ENCOUNTER — Telehealth: Payer: Self-pay | Admitting: *Deleted

## 2017-01-22 NOTE — Telephone Encounter (Signed)
-----   Message from Secundino Ginger sent at 01/21/2017  9:17 AM EDT ----- Regarding: referral Contact: [phone] This pt called [facility] and they would not give her an appt. They said she needed a referral. Can you put in a referral for her to go to [facility] ob/gyn please and let me know and ill call her for you. Thanks [employer]

## 2017-01-22 NOTE — Telephone Encounter (Signed)
Called pt and let her know that I called back over to [place] side and tried to put a referral in for pt electronically and it would not go through. I called [facility] and they gave me fax number to send it and they will contact pt with an appt. I called pt to let her know above and if she does not get  Call from md office by afternoon of Monday she can call me back and I will check again

## 2017-01-25 ENCOUNTER — Telehealth: Payer: Self-pay | Admitting: Obstetrics & Gynecology

## 2017-01-25 ENCOUNTER — Inpatient Hospital Stay: Payer: Medicaid Other

## 2017-01-25 VITALS — BP 137/86 | HR 78 | Temp 98.0°F | Resp 18

## 2017-01-25 DIAGNOSIS — D509 Iron deficiency anemia, unspecified: Secondary | ICD-10-CM | POA: Diagnosis not present

## 2017-01-25 DIAGNOSIS — D500 Iron deficiency anemia secondary to blood loss (chronic): Secondary | ICD-10-CM

## 2017-01-25 MED ORDER — SODIUM CHLORIDE 0.9 % IV SOLN
Freq: Once | INTRAVENOUS | Status: AC
  Administered 2017-01-25: 10:00:00 via INTRAVENOUS
  Filled 2017-01-25: qty 1000

## 2017-01-25 MED ORDER — SODIUM CHLORIDE 0.9 % IV SOLN
510.0000 mg | Freq: Once | INTRAVENOUS | Status: AC
  Administered 2017-01-25: 510 mg via INTRAVENOUS
  Filled 2017-01-25: qty 17

## 2017-01-25 NOTE — Telephone Encounter (Signed)
Pt is being referred by [facility], Dr Janese Banks for Management of her Menorrhagia pt also stated she also has fibroids. I left voicemail For pt to call back to be schedule.

## 2017-01-25 NOTE — Telephone Encounter (Signed)
Pt is schedule 02/16/17 Terri Bruce

## 2017-01-25 NOTE — Patient Instructions (Signed)

## 2017-02-16 ENCOUNTER — Encounter: Payer: Self-pay | Admitting: Obstetrics and Gynecology

## 2017-02-16 ENCOUNTER — Ambulatory Visit (INDEPENDENT_AMBULATORY_CARE_PROVIDER_SITE_OTHER): Payer: Medicaid Other | Admitting: Obstetrics and Gynecology

## 2017-02-16 VITALS — BP 130/90 | HR 87 | Ht 61.0 in | Wt 161.0 lb

## 2017-02-16 DIAGNOSIS — D500 Iron deficiency anemia secondary to blood loss (chronic): Secondary | ICD-10-CM

## 2017-02-16 DIAGNOSIS — D561 Beta thalassemia: Secondary | ICD-10-CM

## 2017-02-16 DIAGNOSIS — N920 Excessive and frequent menstruation with regular cycle: Secondary | ICD-10-CM | POA: Diagnosis not present

## 2017-02-16 NOTE — Progress Notes (Signed)
Obstetrics & Gynecology Office Visit   Chief Complaint  Patient presents with  . Menorrhagia    heavy bleeding wants hysterectomy  Referral from Dr. Randa Evens at [facility] Hematology [facility] Oncology  History of Present Illness: 31 y.o. [id number] female who presents in referral from [facility] for assessment of heavy menstrual bleeding with resultant severe iron deficiency anemia.  The patient states that she has always had very heavy periods. Only while she took birth control in her life did she have more normal menses.  She has not used hormonal birth control for many years.  She has a history of beta thalessemia trait.  She has undergone three cesarean deliveries for her children, the most recent was in 2016 during which time she underwent bilateral tubal ligation. She had a post-operative course complicated by wound disruption requiring opening and packing for part of the wound.  She states that since being young her periods have been very heavy. More recently, they have become heavier and last longer, passing clots. They occur at regular intervals without intermenstrual bleeding. She has tried no hormonal medication recently to control her heavy menses. She has hypertension and has not taken estrogen-containing hormonal contraception.  She has had no recent imaging of her uterus.  According to my records, her last pap smear was in January 2013 and was normal. She denies a history of easy bruising, epistaxis, problems with bleeding after her three c-sections, bleeding of gums with brushing her teeth, hemarthroses.   Review of Systems: Review of Systems  Constitutional: Negative.   HENT: Negative.   Eyes: Negative.   Respiratory: Negative.   Cardiovascular: Negative.  Negative for chest pain, palpitations and leg swelling.  Gastrointestinal: Negative.  Negative for abdominal pain, blood in stool, constipation and melena.    Genitourinary: Negative.   Musculoskeletal: Negative.   Skin: Negative.   Neurological: Negative.   Psychiatric/Behavioral: Negative.     Past Medical History:  Diagnosis Date  . Anemia   . Anxiety   . Bipolar disorder ([place])   . Depression, major   . Hypertension     Past Surgical History:  Procedure Laterality Date  . CESAREAN SECTION WITH BILATERAL TUBAL LIGATION      Gynecologic History: Patient's last menstrual period was 01/29/2017.  Obstetric History: [id number], s/p cesarean section x 3.   Family History  Problem Relation Age of Onset  . Cancer Mother 54       leukemia  . Hypertension Mother   . Hypertension Maternal Aunt   . Hypertension Maternal Uncle   . Cancer Paternal Uncle        Prostate  . Diabetes Maternal Grandfather   . Cancer Paternal Grandfather        colon    Social History   Social History  . Marital status: Single    Spouse name: N/A  . Number of children: N/A  . Years of education: N/A   Occupational History  . Not on file.   Social History Main Topics  . Smoking status: Current Every Day Smoker    Packs/day: 0.25    Years: 15.00    Types: Cigarettes  . Smokeless tobacco: Never Used  . Alcohol use Yes  . Drug use: No  . Sexual activity: Yes    Birth control/ protection: Surgical   Other Topics Concern  . Not on file   Social History Narrative  . No narrative on file    Allergies:  No Known Allergies  Medications:   Medication Sig Start Date End Date Taking? Authorizing Provider  Cholecalciferol (VITAMIN D3) 5000 units TABS Take by mouth.    [provider]  diphenhydrAMINE (BENADRYL) 25 MG tablet Take nightly as needed for stiffness (dystonia) 01/23/15   [provider]  hydrochlorothiazide (HYDRODIURIL) 25 MG tablet Take 25 mg by mouth.    [provider]  lisinopril (PRINIVIL,ZESTRIL) 40 MG tablet Take by mouth.    [provider]    Physical Exam BP 130/90   Pulse 87   Ht 5'  1" (1.549 m)   Wt 161 lb (73 kg)   LMP 01/29/2017   BMI 30.42 kg/m  Patient's last menstrual period was 01/29/2017. Physical Exam  Constitutional: She is oriented to person, place, and time and well-developed, well-nourished, and in no distress. No distress.  HENT:  Head: Normocephalic and atraumatic.  Eyes: Conjunctivae are normal. Left eye exhibits no discharge. No scleral icterus.  Neck: Normal range of motion. Neck supple. No thyromegaly present.  Cardiovascular: Normal rate and regular rhythm.  Exam reveals no gallop and no friction rub.   No murmur heard. Pulmonary/Chest: Effort normal and breath sounds normal. No respiratory distress. She has no wheezes. She has no rales.  Abdominal: Soft. Bowel sounds are normal. She exhibits no distension and no mass. There is no tenderness. There is no rebound and no guarding.  Musculoskeletal: Normal range of motion. She exhibits no edema.  Lymphadenopathy:    She has no cervical adenopathy.  Neurological: She is alert and oriented to person, place, and time. No cranial nerve deficit.  Skin: Skin is warm and dry. No rash noted.  Psychiatric: Judgment normal.    Assessment: 31 y.o. [id number] Menorrhagia Reviewed treatment options, including; 1) doing nothing at this time, 2) hormonal treatment (strongly suggest LNG-IUD), and 3) surgery with either endometrial ablation or hysterectomy. She will consider IUD versus surgery. Will get pelvic ultrasound and perform pap smear with pelvic exam at next visit.  Iron deficiency anemia Per hematology.  Likely will improve with decrease or elimination of menstrual blood loss.  Will complete gynecologic workup for bleeding and discuss plan for management at next appointment.    Plan: Problem List Items Addressed This Visit    Menorrhagia - Primary    Reviewed treatment options, including; 1) doing nothing at this time, 2) hormonal treatment (strongly suggest LNG-IUD), and 3) surgery with either  endometrial ablation or hysterectomy. She will consider IUD versus surgery. Will get pelvic ultrasound and perform pap smear with pelvic exam at next visit.      Relevant Orders   US Transvaginal Non-OB   Thalassemia   Iron deficiency anemia    Per hematology.  Likely will improve with decrease or elimination of menstrual blood loss.  Will complete gynecologic workup for bleeding and discuss plan for management at next appointment.         Prentice Docker, MD 02/18/2017 11:33 AM    CC: Dr. Randa Evens [facility] at [facility], [address], [place],  [phone]

## 2017-02-18 ENCOUNTER — Encounter: Payer: Self-pay | Admitting: Obstetrics and Gynecology

## 2017-02-18 NOTE — Assessment & Plan Note (Signed)
Per hematology.  Likely will improve with decrease or elimination of menstrual blood loss.  Will complete gynecologic workup for bleeding and discuss plan for management at next appointment.

## 2017-02-18 NOTE — Assessment & Plan Note (Signed)
Reviewed treatment options, including; 1) doing nothing at this time, 2) hormonal treatment (strongly suggest LNG-IUD), and 3) surgery with either endometrial ablation or hysterectomy. She will consider IUD versus surgery. Will get pelvic ultrasound and perform pap smear with pelvic exam at next visit.

## 2017-03-03 ENCOUNTER — Ambulatory Visit: Payer: Medicaid Other | Admitting: Obstetrics and Gynecology

## 2017-03-03 ENCOUNTER — Other Ambulatory Visit: Payer: Medicaid Other

## 2017-03-08 ENCOUNTER — Ambulatory Visit (INDEPENDENT_AMBULATORY_CARE_PROVIDER_SITE_OTHER): Payer: Medicaid Other | Admitting: Obstetrics and Gynecology

## 2017-03-08 ENCOUNTER — Ambulatory Visit (INDEPENDENT_AMBULATORY_CARE_PROVIDER_SITE_OTHER): Payer: Medicaid Other

## 2017-03-08 ENCOUNTER — Encounter: Payer: Self-pay | Admitting: Obstetrics and Gynecology

## 2017-03-08 VITALS — BP 124/72 | Ht 61.0 in | Wt 162.0 lb

## 2017-03-08 DIAGNOSIS — D500 Iron deficiency anemia secondary to blood loss (chronic): Secondary | ICD-10-CM

## 2017-03-08 DIAGNOSIS — N920 Excessive and frequent menstruation with regular cycle: Secondary | ICD-10-CM

## 2017-03-08 DIAGNOSIS — D259 Leiomyoma of uterus, unspecified: Secondary | ICD-10-CM | POA: Diagnosis not present

## 2017-03-08 NOTE — Progress Notes (Signed)
Gynecology Ultrasound Follow Up   Chief Complaint  Patient presents with  . Follow-up  heavy vagina bleeding (menorrhagia)   History of Present Illness: Patient is a 31 y.o. female who presents today for ultrasound evaluation of menorrhagia (abnormal uterine bleeding) .  Ultrasound demonstrates the following findgins Adnexa: normal Uterus: anteverted with endometrial stripe  17.0 mm Additional: right lateral fibroid, 2.8 cm in maximal dimension  Review of Systems: Review of Systems  Constitutional: Negative.   HENT: Negative.   Eyes: Negative.   Respiratory: Negative.   Cardiovascular: Negative.   Gastrointestinal: Negative.   Genitourinary: Negative.   Musculoskeletal: Negative.   Skin: Negative.   Neurological: Negative.   Psychiatric/Behavioral: Negative.     Past Medical History:  Diagnosis Date  . Anemia   . Anxiety   . Bipolar disorder ([place])   . Depression, major   . Hypertension     Past Surgical History:  Procedure Laterality Date  . CESAREAN SECTION WITH BILATERAL TUBAL LIGATION      Family History  Problem Relation Age of Onset  . Cancer Mother 61       leukemia  . Hypertension Mother   . Hypertension Maternal Aunt   . Hypertension Maternal Uncle   . Cancer Paternal Uncle        Prostate  . Diabetes Maternal Grandfather   . Cancer Paternal Grandfather        colon    Social History   Social History  . Marital status: Single    Spouse name: N/A  . Number of children: N/A  . Years of education: N/A   Occupational History  . Not on file.   Social History Main Topics  . Smoking status: Current Every Day Smoker    Packs/day: 0.25    Years: 15.00    Types: Cigarettes  . Smokeless tobacco: Never Used  . Alcohol use Yes  . Drug use: No  . Sexual activity: Yes    Birth control/ protection: Surgical   Other Topics Concern  . Not on file   Social History Narrative  . No narrative on file    Allergies: No Known  Allergies  Medications:   Medication Sig Start Date End Date Taking? Authorizing Provider  Cholecalciferol (VITAMIN D3) 5000 units TABS Take by mouth.    [provider]  diphenhydrAMINE (BENADRYL) 25 MG tablet Take nightly as needed for stiffness (dystonia) 01/23/15   [provider]  hydrochlorothiazide (HYDRODIURIL) 25 MG tablet Take 25 mg by mouth.    [provider]  lisinopril (PRINIVIL,ZESTRIL) 40 MG tablet Take by mouth.    [provider]    Physical Exam Vitals: Blood pressure 124/72, height 5\' 1"  (1.549 m), weight 162 lb (73.5 kg), last menstrual period 02/24/2017. Physical Exam  Constitutional: She is oriented to person, place, and time. She appears well-developed and well-nourished. No distress.  HENT:  Head: Normocephalic and atraumatic.  Eyes: Conjunctivae are normal.  Abdominal: Soft. She exhibits no distension and no mass. There is no tenderness. There is no rebound and no guarding. No hernia. Hernia confirmed negative in the right inguinal area and confirmed negative in the left inguinal area.  Genitourinary: Vagina normal and uterus normal. Pelvic exam was performed with patient supine. There is no rash, tenderness, lesion or injury on the right labia. There is no rash, tenderness, lesion or injury on the left labia. Uterus is not deviated, not enlarged, not fixed and not tender. Cervix exhibits no motion tenderness, no  discharge and no friability. Right adnexum displays no mass, no tenderness and no fullness. Left adnexum displays no mass, no tenderness and no fullness.  Musculoskeletal: Normal range of motion.  Lymphadenopathy:       Right: No inguinal adenopathy present.       Left: No inguinal adenopathy present.  Neurological: She is alert and oriented to person, place, and time.  Skin: Skin is warm and dry. No rash noted.  Psychiatric: She has a normal mood and affect. Her behavior is normal. Judgment normal.   Endometrial  Biopsy After discussion with the patient regarding her abnormal uterine bleeding I recommended that she proceed with an endometrial biopsy for further diagnosis. The risks, benefits, alternatives, and indications for an endometrial biopsy were discussed with the patient in detail. She understood the risks including infection, bleeding, cervical laceration and uterine perforation.  Verbal consent was obtained.   PROCEDURE NOTE:  Pipelle endometrial biopsy was performed using aseptic technique with iodine preparation.  The uterus was sounded to a length of 9 cm.  Adequate sampling was obtained with minimal blood loss.  The patient tolerated the procedure well.  Disposition will be pending pathology.  Female chaperone present for pelvic exam.   Assessment: 31 y.o. [id number] with menorrhagia (regular cycle), anemia, and uterine fibroid  Plan: Pap smear today. Endometrial biopsy today F/u with options based on these results.  15 minutes spent in face to face discussion with > 50% spent in counseling and management of her abnormal uterine bleeding (menorrhagia w regular cycle) and anemia.   Prentice Docker, MD 03/08/2017 10:24 AM

## 2017-03-10 LAB — PATHOLOGY

## 2017-03-11 LAB — IGP,CTNG,APTIMAHPV,RFX16/18,45
CHLAMYDIA, NUC. ACID AMP: NEGATIVE
GONOCOCCUS BY NUCLEIC ACID AMP: NEGATIVE
HPV APTIMA: NEGATIVE
PAP SMEAR COMMENT: 0

## 2017-03-22 ENCOUNTER — Inpatient Hospital Stay: Payer: Medicaid Other

## 2017-03-22 ENCOUNTER — Inpatient Hospital Stay: Payer: Medicaid Other | Attending: Oncology | Admitting: Oncology

## 2017-03-22 ENCOUNTER — Telehealth: Payer: Self-pay

## 2017-03-22 NOTE — Progress Notes (Signed)
Hematology/Oncology Consult note [facility]  Telephone:([phone] Fax:([phone]) [phone]  Patient Care Team: Remi Haggard, FNP as PCP - General (Family Medicine)   Name of the patient: Terri Bruce  [id number]  May 23, 1986   Date of visit: 03/22/17  Diagnosis- iron deficiency anemia likely due to menorrhagia  Chief complaint/ Reason for visit- routine f/u  Heme/Onc history: patient is a 31 year old African-American female with a history of beta thalassemia trait. She has had 3 prior pregnancy in the last pregnancy was in 2016. She has had tubal ligation done since then. She has been referred to [place] for iron deficiency anemia. CBC from April 2017 showed white count of 4.6, H&H of 10.8/35.9 with the MCV is 69.2 and a platelet count of 219. CMP at that time was within normal limits. Most recent CBC from 12/30/2016 revealed white count of 4.7, H&H of 7.4/28 with MCV of 59 and a platelet count of 182. Patient reports having heavy menstrual cycles which last for 8 days. The first 4 days are particularly heavy. She also reports having history of fibroids. She has tried birth control in the past and it did not help. She also reports having daily headaches and takes one tablet of ibuprofen on a daily basis. He reports it fatigue. Denies other complaints. Denies any gum bleeds, nosebleeds, blood in sputum stool or urine. She has not had any significant bleeding complications with prior surgeries.  Results of bloodwork from 01/18/2017 were as follows: CBC showed white count of 4.7, H&H of 7.5/26 with an MCV of 54.8 and a platelet count of 250. Ferritin was low at 3. Iron saturation was low at 3%. TIBC was elevated at 569 and serum iron was low at 17%. We B12 and folate were within normal limits and reticulocyte count was low at 1.3% indicative of hypoproliferative anemia  Patient did undergo endometrial biopsy on 03/08/2017 which showed proliferative phase endometrium.  Negative for hyperplasia or malignancy.      Review of systems- Review of Systems  Constitutional: Negative for chills, fever, malaise/fatigue and weight loss.  HENT: Negative for congestion, ear discharge and nosebleeds.   Eyes: Negative for blurred vision.  Respiratory: Negative for cough, hemoptysis, sputum production, shortness of breath and wheezing.   Cardiovascular: Negative for chest pain, palpitations, orthopnea and claudication.  Gastrointestinal: Negative for abdominal pain, blood in stool, constipation, diarrhea, heartburn, melena, nausea and vomiting.  Genitourinary: Negative for dysuria, flank pain, frequency, hematuria and urgency.  Musculoskeletal: Negative for back pain, joint pain and myalgias.  Skin: Negative for rash.  Neurological: Negative for dizziness, tingling, focal weakness, seizures, weakness and headaches.  Endo/Heme/Allergies: Does not bruise/bleed easily.  Psychiatric/Behavioral: Negative for depression and suicidal ideas. The patient does not have insomnia.        No Known Allergies   Past Medical History:  Diagnosis Date  . Anemia   . Anxiety   . Bipolar disorder ([place])   . Depression, major   . Hypertension      Past Surgical History:  Procedure Laterality Date  . CESAREAN SECTION WITH BILATERAL TUBAL LIGATION      Social History   Social History  . Marital status: Single    Spouse name: N/A  . Number of children: N/A  . Years of education: N/A   Occupational History  . Not on file.   Social History Main Topics  . Smoking status: Current Every Day Smoker    Packs/day: 0.25    Years: 15.00  Types: Cigarettes  . Smokeless tobacco: Never Used  . Alcohol use Yes  . Drug use: No  . Sexual activity: Yes    Birth control/ protection: Surgical   Other Topics Concern  . Not on file   Social History Narrative  . No narrative on file    Family History  Problem Relation Age of Onset  . Cancer Mother 49       leukemia  .  Hypertension Mother   . Hypertension Maternal Aunt   . Hypertension Maternal Uncle   . Cancer Paternal Uncle        Prostate  . Diabetes Maternal Grandfather   . Cancer Paternal Grandfather        colon     Current Outpatient Prescriptions:  .  Cholecalciferol (VITAMIN D3) 5000 units TABS, Take by mouth., Disp: , Rfl:  .  diphenhydrAMINE (BENADRYL) 25 MG tablet, Take nightly as needed for stiffness (dystonia), Disp: , Rfl:  .  hydrochlorothiazide (HYDRODIURIL) 25 MG tablet, Take 25 mg by mouth., Disp: , Rfl:  .  lisinopril (PRINIVIL,ZESTRIL) 40 MG tablet, Take by mouth., Disp: , Rfl:   Physical exam: There were no vitals filed for this visit. Physical Exam  Constitutional: She is oriented to person, place, and time and well-developed, well-nourished, and in no distress.  HENT:  Head: Normocephalic and atraumatic.  Eyes: EOM are normal. Pupils are equal, round, and reactive to light.  Neck: Normal range of motion.  Cardiovascular: Normal rate, regular rhythm and normal heart sounds.   Pulmonary/Chest: Effort normal and breath sounds normal.  Abdominal: Soft. Bowel sounds are normal.  Neurological: She is alert and oriented to person, place, and time.  Skin: Skin is warm and dry.     CMP Latest Ref Rng & Units 01/07/2015  Glucose mg/dL 78  BUN mg/dL 7  Creatinine mg/dL 0.49  Sodium mmol/L 137  Potassium mmol/L 3.3(L)  Chloride mmol/L 107  CO2 mmol/L 22  Calcium mg/dL 8.6(L)  Total Protein g/dL 6.6  Total Bilirubin mg/dL 0.3  Alkaline Phos U/L 122  AST U/L 21  ALT U/L 15   CBC Latest Ref Rng & Units 01/18/2017  WBC 3.6 - 11.0 K/uL 4.7  Hemoglobin 12.0 - 16.0 g/dL 7.5(L)  Hematocrit 35.0 - 47.0 % 26.0(L)  Platelets 150 - 440 K/uL 250     Assessment and plan- Patient is a 31 y.o. female with iron deficiency anemia likely due to menorrhagia  Patient received 2 doses of feraheme and her H/H today is. Iron studies pending.    Visit Diagnosis 1. Iron deficiency anemia,  unspecified iron deficiency anemia type      Dr. Randa Evens, MD, MPH [facility] Pager- [phone] 03/22/2017 8:49 AM

## 2017-03-22 NOTE — Telephone Encounter (Signed)
Pt calling for results.  Waiting to see if she can get hyst.  What are the next steps?  [phone]

## 2017-03-23 ENCOUNTER — Telehealth: Payer: Self-pay | Admitting: Obstetrics and Gynecology

## 2017-03-23 NOTE — Telephone Encounter (Signed)
Spoke with patient and gave normal results. Reviewed all options for treatment (no treatment, hormonal manipulation, surgery with either ablation or hysterectomy) After long discussion of pros/cons of each, patient elects hysterectomy. Will schedule.

## 2017-03-26 ENCOUNTER — Telehealth: Payer: Self-pay | Admitting: Obstetrics and Gynecology

## 2017-03-26 NOTE — Telephone Encounter (Signed)
Patient is aware of H&P at [facility] on 04/07/17 @ 9:10am with Dr. Glennon Mac, Pre-admit Testing afterwards, and OR on 04/15/17.

## 2017-03-26 NOTE — Telephone Encounter (Signed)
Lmtrc

## 2017-03-26 NOTE — Telephone Encounter (Signed)
-----   Message from Will Bonnet, MD sent at 03/23/2017  8:48 AM EDT ----- Regarding: Schedule Surgery Surgery Booking Request Patient Full Name: Terri Bruce   MRN: [id number]  DOB: [birth year]  Surgeon: Prentice Docker, MD  Requested Surgery Date and Time: when time available and another surgeon in [place] with me.  Primary Diagnosis and Code: menorrhagia, chronic blood loss anemia Secondary Diagnosis and Code:  Surgical Procedure: TLH, BS, cystosocy L&D Notification: No Admission Status: same day surgery Length of Surgery: 2 hours Special Case Needs: none H&P: tbd (date) Phone Interview or Office Pre-Admit: pre admit Interpreter: n/a Language: english Medical Clearance: n/a Special Scheduling Instructions: none

## 2017-04-07 ENCOUNTER — Ambulatory Visit (INDEPENDENT_AMBULATORY_CARE_PROVIDER_SITE_OTHER): Payer: Medicaid Other | Admitting: Obstetrics and Gynecology

## 2017-04-07 ENCOUNTER — Encounter: Payer: Self-pay | Admitting: Obstetrics and Gynecology

## 2017-04-07 ENCOUNTER — Encounter
Admission: RE | Admit: 2017-04-07 | Discharge: 2017-04-07 | Disposition: A | Discharge status: Home / Self Care | Payer: Medicaid Other | Source: Ambulatory Visit | Attending: Obstetrics and Gynecology | Admitting: Obstetrics and Gynecology

## 2017-04-07 VITALS — BP 118/74 | Ht 61.0 in | Wt 165.0 lb

## 2017-04-07 DIAGNOSIS — Z01810 Encounter for preprocedural cardiovascular examination: Secondary | ICD-10-CM | POA: Diagnosis not present

## 2017-04-07 DIAGNOSIS — Z01812 Encounter for preprocedural laboratory examination: Secondary | ICD-10-CM | POA: Diagnosis present

## 2017-04-07 DIAGNOSIS — D259 Leiomyoma of uterus, unspecified: Secondary | ICD-10-CM

## 2017-04-07 DIAGNOSIS — N920 Excessive and frequent menstruation with regular cycle: Secondary | ICD-10-CM

## 2017-04-07 DIAGNOSIS — I10 Essential (primary) hypertension: Secondary | ICD-10-CM | POA: Insufficient documentation

## 2017-04-07 DIAGNOSIS — D509 Iron deficiency anemia, unspecified: Secondary | ICD-10-CM

## 2017-04-07 HISTORY — DX: Excessive and frequent menstruation with regular cycle: N92.0

## 2017-04-07 LAB — POTASSIUM: POTASSIUM: 3.9 mmol/L (ref 3.5–5.1)

## 2017-04-07 LAB — TYPE AND SCREEN
ABO/RH(D): O POS
Antibody Screen: NEGATIVE

## 2017-04-07 LAB — PROTIME-INR
INR: 1.01
PROTHROMBIN TIME: 13.3 s (ref 11.4–15.2)

## 2017-04-07 LAB — APTT: aPTT: 27 seconds (ref 24–36)

## 2017-04-07 NOTE — Progress Notes (Signed)
Preoperative History and Physical  AVIANNA MOYNAHAN is a 31 y.o. [phone] here for surgical management of heavy  Vaginal bleeding with resultant anemia.   No significant preoperative concerns.  History of Present Illness: 31 y.o. G72P3003 female who presented originally in referral from a hemotology/oncology specialist due to severe menstrual bleeding severe resultant anemia.  Her workup so far has revealed a small uterine fibroid.  Her pap smear was normal and her endometrial biopsy was normal.  She has been counseled regarding the options and elects for definitive surgical management of her bleeding.   Proposed surgery: Total laparoscopic hysterectomy, bilateral salpingectomy, cystoscopy.   Past Medical History:  Diagnosis Date  . Anemia   . Anxiety   . Bipolar disorder ([place])   . Depression, major   . Hypertension    Past Surgical History:  Procedure Laterality Date  . CESAREAN SECTION WITH BILATERAL TUBAL LIGATION     OB History  Gravida Para Term Preterm AB Living  3 3 3     3   SAB TAB Ectopic Multiple Live Births               # Outcome Date GA Lbr Len/2nd Weight Sex Delivery Anes PTL Lv  3 Term           2 Term           1 Term             Patient denies any other pertinent gynecologic issues.   Current Outpatient Prescriptions on File Prior to Visit  Medication Sig Dispense Refill  . hydrochlorothiazide (MICROZIDE) 12.5 MG capsule Take 12.5 mg by mouth daily.    Marland Kitchen ibuprofen (ADVIL,MOTRIN) 200 MG tablet Take 800 mg by mouth every 8 (eight) hours as needed for headache or mild pain.    Marland Kitchen lisinopril (PRINIVIL,ZESTRIL) 20 MG tablet Take 20 mg by mouth daily.    . Multiple Vitamin (MULTIVITAMIN WITH MINERALS) TABS tablet Take 1 tablet by mouth 3 (three) times a week.     No current facility-administered medications on file prior to visit.    Allergies: No Known Allergies  Social History:   reports that she has been smoking Cigarettes.  She has a 3.75 pack-year smoking  history. She has never used smokeless tobacco. She reports that she drinks alcohol. She reports that she does not use drugs.  Family History  Problem Relation Age of Onset  . Cancer Mother 57       leukemia  . Hypertension Mother   . Hypertension Maternal Aunt   . Hypertension Maternal Uncle   . Cancer Paternal Uncle        Prostate  . Diabetes Maternal Grandfather   . Cancer Paternal Grandfather        colon    Review of Systems: Noncontributory  PHYSICAL EXAM: Blood pressure 118/74, height 5\' 1"  (1.549 m), weight 165 lb (74.8 kg). CONSTITUTIONAL: Well-developed, well-nourished female in no acute distress.  HENT:  Normocephalic, atraumatic, External right and left ear normal. Oropharynx is clear and moist EYES: Conjunctivae and EOM are normal. Pupils are equal, round, and reactive to light. No scleral icterus.  NECK: Normal range of motion, supple, no masses SKIN: Skin is warm and dry. No rash noted. Not diaphoretic. No erythema. No pallor. [place]: Alert and oriented to person, place, and time. Normal reflexes, muscle tone coordination. No cranial nerve deficit noted. PSYCHIATRIC: Normal mood and affect. Normal behavior. Normal judgment and thought content. CARDIOVASCULAR: Normal heart rate noted,  regular rhythm RESPIRATORY: Effort and breath sounds normal, no problems with respiration noted ABDOMEN: Soft, nontender, nondistended. PELVIC: Deferred MUSCULOSKELETAL: Normal range of motion. No edema and no tenderness. 2+ distal pulses.  Assessment: Patient Active Problem List   Diagnosis Date Noted  . Fibroid uterus 03/08/2017  . Hypertension 01/18/2017  . Menorrhagia 01/18/2017  . Thalassemia 01/18/2017  . Iron deficiency anemia 01/18/2017    Plan: Patient will undergo surgical management with TLH/BS with cystoscopy.   The risks of surgery were discussed in detail with the patient including but not limited to: bleeding which may require transfusion or reoperation;  infection which may require antibiotics; injury to surrounding organs which may involve bowel, bladder, ureters ; need for additional procedures including laparoscopy or laparotomy; thromboembolic phenomenon, surgical site problems and other postoperative/anesthesia complications. Likelihood of success in alleviating the patient's condition was discussed. Routine postoperative instructions will be reviewed with the patient and her family in detail after surgery.  The patient concurred with the proposed plan, giving informed written consent for the surgery. Preoperative prophylactic antibiotics and SCDs ordered on call to the OR.    Prentice Docker, MD 04/07/2017 9:30 AM

## 2017-04-07 NOTE — Patient Instructions (Signed)
Your procedure is scheduled on: 04/15/17 Thurs Report to Same Day Surgery 2nd floor medical mall [facility] Entrance-take elevator on left to 2nd floor.  Check in with surgery information desk.) To find out your arrival time please call [phone] between 1PM - 3PM on 04/14/17 Wed  Remember: Instructions that are not followed completely may result in serious medical risk, up to and including death, or upon the discretion of your surgeon and anesthesiologist your surgery may need to be rescheduled.    _x___ 1. Do not eat food or drink liquids after midnight. No gum chewing or                              hard candies.     __x__ 2. No Alcohol for 24 hours before or after surgery.   __x__3. No Smoking for 24 prior to surgery.   ____  4. Bring all medications with you on the day of surgery if instructed.    __x__ 5. Notify your doctor if there is any change in your medical condition     (cold, fever, infections).     Do not wear jewelry, make-up, hairpins, clips or nail polish.  Do not wear lotions, powders, or perfumes. You may wear deodorant.  Do not shave 48 hours prior to surgery. Men may shave face and neck.  Do not bring valuables to the hospital.    [facility] is not responsible for any belongings or valuables.               Contacts, dentures or bridgework may not be worn into surgery.  Leave your suitcase in the car. After surgery it may be brought to your room.  For patients admitted to the hospital, discharge time is determined by your                       treatment team.   Patients discharged the day of surgery will not be allowed to drive home.  You will need someone to drive you home and stay with you the night of your procedure.    Please read over the following fact sheets that you were given:   [facility] Preparing for Surgery and or MRSA Information   _x___ Take anti-hypertensive (unless it includes a diuretic), cardiac, seizure, asthma,     anti-reflux and  psychiatric medicines. These include:  1. lisinopril (PRINIVIL,ZESTRIL  2.  3.  4.  5.  6.  ____Fleets enema or Magnesium Citrate as directed.   _x___ Use CHG Soap or sage wipes as directed on instruction sheet   ____ Use inhalers on the day of surgery and bring to hospital day of surgery  ____ Stop Metformin and Janumet 2 days prior to surgery.    ____ Take 1/2 of usual insulin dose the night before surgery and none on the morning     surgery.   _x___ Follow recommendations from Cardiologist, Pulmonologist or PCP regarding          stopping Aspirin, Coumadin, Pllavix ,Eliquis, Effient, or Pradaxa, and Pletal.  X____Stop Anti-inflammatories such as Advil, Aleve, Ibuprofen, Motrin, Naproxen, Naprosyn, Goodies powders or aspirin products. OK to take Tylenol and                          Celebrex.   _x___ Stop supplements until after surgery.  But may continue Vitamin D, Vitamin  B,       and multivitamin.   ____ Bring C-Pap to the hospital.

## 2017-04-08 MED ORDER — POTASSIUM CHLORIDE 20 MEQ PO PACK: 40.0000 meq | PACK | Freq: Once | ORAL | 0 refills | Status: DC

## 2017-04-08 NOTE — Addendum Note (Signed)
Addended by: Prentice Docker D on: 04/08/2017 11:08 AM   Modules accepted: Orders

## 2017-04-08 NOTE — Addendum Note (Signed)
Addended by: Prentice Docker D on: 04/08/2017 11:09 AM   Modules accepted: Orders

## 2017-04-14 MED ORDER — CEFAZOLIN SODIUM-DEXTROSE 2-4 GM/100ML-% IV SOLN
2.0000 g | INTRAVENOUS | Status: AC
  Administered 2017-04-15: 2 g via INTRAVENOUS

## 2017-04-15 ENCOUNTER — Encounter: Payer: Self-pay | Admitting: *Deleted

## 2017-04-15 ENCOUNTER — Ambulatory Visit
Admission: RE | Admit: 2017-04-15 | Discharge: 2017-04-15 | Disposition: A | Discharge status: Home / Self Care | Payer: Medicaid Other | Source: Ambulatory Visit | Attending: Obstetrics and Gynecology | Admitting: Obstetrics and Gynecology

## 2017-04-15 ENCOUNTER — Ambulatory Visit: Payer: Medicaid Other | Admitting: Anesthesiology

## 2017-04-15 ENCOUNTER — Encounter
Admission: RE | Disposition: A | Discharge status: Home / Self Care | Payer: Self-pay | Source: Ambulatory Visit | Attending: Obstetrics and Gynecology

## 2017-04-15 DIAGNOSIS — Z800 Family history of malignant neoplasm of digestive organs: Secondary | ICD-10-CM | POA: Diagnosis not present

## 2017-04-15 DIAGNOSIS — F419 Anxiety disorder, unspecified: Secondary | ICD-10-CM | POA: Diagnosis not present

## 2017-04-15 DIAGNOSIS — Z8249 Family history of ischemic heart disease and other diseases of the circulatory system: Secondary | ICD-10-CM | POA: Insufficient documentation

## 2017-04-15 DIAGNOSIS — F17210 Nicotine dependence, cigarettes, uncomplicated: Secondary | ICD-10-CM | POA: Diagnosis not present

## 2017-04-15 DIAGNOSIS — D500 Iron deficiency anemia secondary to blood loss (chronic): Secondary | ICD-10-CM | POA: Diagnosis not present

## 2017-04-15 DIAGNOSIS — Z8042 Family history of malignant neoplasm of prostate: Secondary | ICD-10-CM | POA: Diagnosis not present

## 2017-04-15 DIAGNOSIS — Z806 Family history of leukemia: Secondary | ICD-10-CM | POA: Insufficient documentation

## 2017-04-15 DIAGNOSIS — I10 Essential (primary) hypertension: Secondary | ICD-10-CM | POA: Insufficient documentation

## 2017-04-15 DIAGNOSIS — Z79899 Other long term (current) drug therapy: Secondary | ICD-10-CM | POA: Insufficient documentation

## 2017-04-15 DIAGNOSIS — N920 Excessive and frequent menstruation with regular cycle: Secondary | ICD-10-CM | POA: Diagnosis not present

## 2017-04-15 DIAGNOSIS — Z833 Family history of diabetes mellitus: Secondary | ICD-10-CM | POA: Insufficient documentation

## 2017-04-15 DIAGNOSIS — F319 Bipolar disorder, unspecified: Secondary | ICD-10-CM | POA: Diagnosis not present

## 2017-04-15 DIAGNOSIS — D569 Thalassemia, unspecified: Secondary | ICD-10-CM | POA: Diagnosis not present

## 2017-04-15 DIAGNOSIS — D259 Leiomyoma of uterus, unspecified: Secondary | ICD-10-CM | POA: Insufficient documentation

## 2017-04-15 HISTORY — PX: LAPAROSCOPIC BILATERAL SALPINGECTOMY: SHX5889

## 2017-04-15 HISTORY — PX: LAPAROSCOPIC SUPRACERVICAL HYSTERECTOMY: SHX5399

## 2017-04-15 HISTORY — PX: CYSTOSCOPY: SHX5120

## 2017-04-15 LAB — COMPREHENSIVE METABOLIC PANEL
ALK PHOS: 51 U/L (ref 38–126)
ALT: 21 U/L (ref 14–54)
AST: 19 U/L (ref 15–41)
Albumin: 4.3 g/dL (ref 3.5–5.0)
Anion gap: 6 (ref 5–15)
BUN: 14 mg/dL (ref 6–20)
CALCIUM: 8.9 mg/dL (ref 8.9–10.3)
CHLORIDE: 108 mmol/L (ref 101–111)
CO2: 24 mmol/L (ref 22–32)
CREATININE: 0.79 mg/dL (ref 0.44–1.00)
Glucose, Bld: 98 mg/dL (ref 65–99)
Potassium: 3.7 mmol/L (ref 3.5–5.1)
Sodium: 138 mmol/L (ref 135–145)
Total Bilirubin: 0.6 mg/dL (ref 0.3–1.2)
Total Protein: 7.6 g/dL (ref 6.5–8.1)

## 2017-04-15 LAB — CBC
HEMATOCRIT: 33.7 % — AB (ref 35.0–47.0)
HEMOGLOBIN: 10.6 g/dL — AB (ref 12.0–16.0)
MCH: 22 pg — AB (ref 26.0–34.0)
MCHC: 31.6 g/dL — ABNORMAL LOW (ref 32.0–36.0)
MCV: 69.7 fL — AB (ref 80.0–100.0)
Platelets: 198 10*3/uL (ref 150–440)
RBC: 4.83 MIL/uL (ref 3.80–5.20)
RDW: 16.3 % — ABNORMAL HIGH (ref 11.5–14.5)
WBC: 4.9 10*3/uL (ref 3.6–11.0)

## 2017-04-15 LAB — ABO/RH: ABO/RH(D): O POS

## 2017-04-15 LAB — POCT PREGNANCY, URINE: PREG TEST UR: NEGATIVE

## 2017-04-15 SURGERY — HYSTERECTOMY, SUPRACERVICAL, LAPAROSCOPIC
Anesthesia: General | Wound class: Clean Contaminated

## 2017-04-15 MED ORDER — BUPIVACAINE HCL (PF) 0.5 % IJ SOLN
INTRAMUSCULAR | Status: AC
  Filled 2017-04-15: qty 30

## 2017-04-15 MED ORDER — OXYCODONE-ACETAMINOPHEN 5-325 MG PO TABS
2.0000 | ORAL_TABLET | Freq: Four times a day (QID) | ORAL | Status: DC | PRN
Start: 2017-04-15 — End: 2017-04-15
  Administered 2017-04-15: 2 via ORAL

## 2017-04-15 MED ORDER — EPHEDRINE SULFATE 50 MG/ML IJ SOLN
INTRAMUSCULAR | Status: DC | PRN
  Administered 2017-04-15: 10 mg via INTRAVENOUS

## 2017-04-15 MED ORDER — SEVOFLURANE IN SOLN
RESPIRATORY_TRACT | Status: AC
  Filled 2017-04-15: qty 250

## 2017-04-15 MED ORDER — HYDROMORPHONE HCL 1 MG/ML IJ SOLN
0.5000 mg | INTRAMUSCULAR | Status: DC | PRN
  Administered 2017-04-15 (×3): 0.5 mg via INTRAVENOUS

## 2017-04-15 MED ORDER — FAMOTIDINE 20 MG PO TABS
ORAL_TABLET | ORAL | Status: AC
  Filled 2017-04-15: qty 1

## 2017-04-15 MED ORDER — MIDAZOLAM HCL 2 MG/2ML IJ SOLN
INTRAMUSCULAR | Status: AC
  Filled 2017-04-15: qty 2

## 2017-04-15 MED ORDER — FENTANYL CITRATE (PF) 100 MCG/2ML IJ SOLN
INTRAMUSCULAR | Status: AC
  Administered 2017-04-15: 25 ug via INTRAVENOUS
  Filled 2017-04-15: qty 2

## 2017-04-15 MED ORDER — SUGAMMADEX SODIUM 200 MG/2ML IV SOLN
INTRAVENOUS | Status: AC
  Filled 2017-04-15: qty 2

## 2017-04-15 MED ORDER — FENTANYL CITRATE (PF) 100 MCG/2ML IJ SOLN
25.0000 ug | INTRAMUSCULAR | Status: DC | PRN
  Administered 2017-04-15 (×4): 25 ug via INTRAVENOUS

## 2017-04-15 MED ORDER — HYDROMORPHONE HCL 1 MG/ML IJ SOLN
INTRAMUSCULAR | Status: AC
  Administered 2017-04-15: 0.5 mg via INTRAVENOUS
  Filled 2017-04-15: qty 1

## 2017-04-15 MED ORDER — ROCURONIUM BROMIDE 100 MG/10ML IV SOLN
INTRAVENOUS | Status: DC | PRN
  Administered 2017-04-15: 50 mg via INTRAVENOUS
  Administered 2017-04-15 (×3): 10 mg via INTRAVENOUS

## 2017-04-15 MED ORDER — IBUPROFEN 600 MG PO TABS: 600.0000 mg | ORAL_TABLET | Freq: Four times a day (QID) | ORAL | 0 refills | Status: AC | PRN

## 2017-04-15 MED ORDER — OXYCODONE-ACETAMINOPHEN 5-325 MG PO TABS: 2.0000 | ORAL_TABLET | Freq: Four times a day (QID) | ORAL | 0 refills | Status: DC | PRN

## 2017-04-15 MED ORDER — PROPOFOL 10 MG/ML IV BOLUS
INTRAVENOUS | Status: DC | PRN
  Administered 2017-04-15: 140 mg via INTRAVENOUS

## 2017-04-15 MED ORDER — ROCURONIUM BROMIDE 50 MG/5ML IV SOLN
INTRAVENOUS | Status: AC
  Filled 2017-04-15: qty 1

## 2017-04-15 MED ORDER — OXYCODONE-ACETAMINOPHEN 5-325 MG PO TABS
ORAL_TABLET | ORAL | Status: AC
  Filled 2017-04-15: qty 2

## 2017-04-15 MED ORDER — FENTANYL CITRATE (PF) 100 MCG/2ML IJ SOLN
INTRAMUSCULAR | Status: AC
  Filled 2017-04-15: qty 2

## 2017-04-15 MED ORDER — DEXAMETHASONE SODIUM PHOSPHATE 10 MG/ML IJ SOLN
INTRAMUSCULAR | Status: AC
  Filled 2017-04-15: qty 1

## 2017-04-15 MED ORDER — ONDANSETRON HCL 4 MG/2ML IJ SOLN
INTRAMUSCULAR | Status: AC
  Filled 2017-04-15: qty 2

## 2017-04-15 MED ORDER — FENTANYL CITRATE (PF) 100 MCG/2ML IJ SOLN
INTRAMUSCULAR | Status: DC | PRN
Start: 2017-04-15 — End: 2017-04-15
  Administered 2017-04-15: 50 ug via INTRAVENOUS
  Administered 2017-04-15: 100 ug via INTRAVENOUS
  Administered 2017-04-15 (×3): 50 ug via INTRAVENOUS

## 2017-04-15 MED ORDER — ROCURONIUM BROMIDE 50 MG/5ML IV SOLN
INTRAVENOUS | Status: AC
Start: 2017-04-15 — End: 2017-04-15
  Filled 2017-04-15: qty 1

## 2017-04-15 MED ORDER — PROPOFOL 10 MG/ML IV BOLUS
INTRAVENOUS | Status: AC
  Filled 2017-04-15: qty 20

## 2017-04-15 MED ORDER — METHYLENE BLUE 0.5 % INJ SOLN
INTRAVENOUS | Status: AC
  Filled 2017-04-15: qty 10

## 2017-04-15 MED ORDER — LACTATED RINGERS IV SOLN
INTRAVENOUS | Status: DC
  Administered 2017-04-15 (×3): via INTRAVENOUS

## 2017-04-15 MED ORDER — FENTANYL CITRATE (PF) 250 MCG/5ML IJ SOLN
INTRAMUSCULAR | Status: AC
  Filled 2017-04-15: qty 5

## 2017-04-15 MED ORDER — LIDOCAINE HCL (CARDIAC) 20 MG/ML IV SOLN
INTRAVENOUS | Status: DC | PRN
  Administered 2017-04-15: 40 mg via INTRAVENOUS

## 2017-04-15 MED ORDER — ONDANSETRON HCL 4 MG/2ML IJ SOLN
INTRAMUSCULAR | Status: DC | PRN
  Administered 2017-04-15: 4 mg via INTRAVENOUS

## 2017-04-15 MED ORDER — ACETAMINOPHEN 10 MG/ML IV SOLN
INTRAVENOUS | Status: DC | PRN
  Administered 2017-04-15: 1000 mg via INTRAVENOUS

## 2017-04-15 MED ORDER — LIDOCAINE HCL (PF) 2 % IJ SOLN
INTRAMUSCULAR | Status: AC
  Filled 2017-04-15: qty 2

## 2017-04-15 MED ORDER — ACETAMINOPHEN NICU IV SYRINGE 10 MG/ML
INTRAVENOUS | Status: AC
  Filled 2017-04-15: qty 1

## 2017-04-15 MED ORDER — MIDAZOLAM HCL 2 MG/2ML IJ SOLN
INTRAMUSCULAR | Status: DC | PRN
  Administered 2017-04-15: 2 mg via INTRAVENOUS

## 2017-04-15 MED ORDER — CEFAZOLIN SODIUM-DEXTROSE 2-4 GM/100ML-% IV SOLN
INTRAVENOUS | Status: AC
  Filled 2017-04-15: qty 100

## 2017-04-15 MED ORDER — EPHEDRINE SULFATE 50 MG/ML IJ SOLN
INTRAMUSCULAR | Status: AC
  Filled 2017-04-15: qty 1

## 2017-04-15 MED ORDER — ONDANSETRON 8 MG PO TBDP: 8.0000 mg | ORAL_TABLET | Freq: Three times a day (TID) | ORAL | 0 refills | Status: DC | PRN

## 2017-04-15 MED ORDER — BUPIVACAINE HCL 0.5 % IJ SOLN
INTRAMUSCULAR | Status: DC | PRN
  Administered 2017-04-15: 11 mL

## 2017-04-15 MED ORDER — LACTATED RINGERS IV SOLN: INTRAVENOUS | Status: DC

## 2017-04-15 MED ORDER — SODIUM CHLORIDE 0.9 % IJ SOLN
INTRAVENOUS | Status: DC | PRN
  Administered 2017-04-15: 240 mL

## 2017-04-15 MED ORDER — FAMOTIDINE 20 MG PO TABS
20.0000 mg | ORAL_TABLET | Freq: Once | ORAL | Status: AC
  Administered 2017-04-15: 20 mg via ORAL

## 2017-04-15 MED ORDER — DEXAMETHASONE SODIUM PHOSPHATE 10 MG/ML IJ SOLN
INTRAMUSCULAR | Status: DC | PRN
  Administered 2017-04-15: 10 mg via INTRAVENOUS

## 2017-04-15 MED ORDER — ONDANSETRON HCL 4 MG/2ML IJ SOLN: 4.0000 mg | Freq: Once | INTRAMUSCULAR | Status: DC | PRN

## 2017-04-15 MED ORDER — SUGAMMADEX SODIUM 200 MG/2ML IV SOLN
INTRAVENOUS | Status: DC | PRN
  Administered 2017-04-15: 150 mg via INTRAVENOUS

## 2017-04-15 SURGICAL SUPPLY — 60 items
BAG URO DRAIN 2000ML W/SPOUT (MISCELLANEOUS) ×5 IMPLANT
BLADE SURG SZ10 CARB STEEL (BLADE) ×10 IMPLANT
BLADE SURG SZ11 CARB STEEL (BLADE) ×5 IMPLANT
CATH FOLEY 2WAY  5CC 16FR (CATHETERS) ×2
CATH URTH 16FR FL 2W BLN LF (CATHETERS) ×3 IMPLANT
CHLORAPREP W/TINT 26ML (MISCELLANEOUS) ×5 IMPLANT
DEFOGGER SCOPE WARMER CLEARIFY (MISCELLANEOUS) ×5 IMPLANT
DERMABOND ADVANCED (GAUZE/BANDAGES/DRESSINGS) ×2
DERMABOND ADVANCED .7 DNX12 (GAUZE/BANDAGES/DRESSINGS) ×3 IMPLANT
DEVICE SUTURE ENDOST 10MM (ENDOMECHANICALS) IMPLANT
DRAPE LEGGINS SURG 28X43 STRL (DRAPES) ×5 IMPLANT
DRAPE SHEET LG 3/4 BI-LAMINATE (DRAPES) ×5 IMPLANT
DRAPE UNDER BUTTOCK W/FLU (DRAPES) ×5 IMPLANT
DRSG OPSITE POSTOP 4X6 (GAUZE/BANDAGES/DRESSINGS) ×5 IMPLANT
GLOVE BIO SURGEON STRL SZ7 (GLOVE) ×20 IMPLANT
GLOVE INDICATOR 7.5 STRL GRN (GLOVE) ×20 IMPLANT
GOWN STRL REUS W/ TWL LRG LVL3 (GOWN DISPOSABLE) ×12 IMPLANT
GOWN STRL REUS W/TWL LRG LVL3 (GOWN DISPOSABLE) ×8
GRASPER SUT TROCAR 14GX15 (MISCELLANEOUS) ×5 IMPLANT
HANDLE YANKAUER SUCT BULB TIP (MISCELLANEOUS) ×5 IMPLANT
IRRIGATION STRYKERFLOW (MISCELLANEOUS) ×3 IMPLANT
IRRIGATOR STRYKERFLOW (MISCELLANEOUS) ×5
IV LACTATED RINGERS 1000ML (IV SOLUTION) ×10 IMPLANT
KIT PINK PAD W/HEAD ARE REST (MISCELLANEOUS) ×5
KIT PINK PAD W/HEAD ARM REST (MISCELLANEOUS) ×3 IMPLANT
KIT RM TURNOVER CYSTO AR (KITS) ×5 IMPLANT
LABEL OR SOLS (LABEL) ×5 IMPLANT
LIGASURE BLUNT 5MM 37CM (INSTRUMENTS) IMPLANT
MANIPULATOR VCARE LG CRV RETR (MISCELLANEOUS) ×5 IMPLANT
MANIPULATOR VCARE SML CRV RETR (MISCELLANEOUS) IMPLANT
MANIPULATOR VCARE STD CRV RETR (MISCELLANEOUS) IMPLANT
NDL SAFETY 22GX1.5 (NEEDLE) ×5 IMPLANT
OCCLUDER COLPOPNEUMO (BALLOONS) ×5 IMPLANT
PACK LAP CHOLECYSTECTOMY (MISCELLANEOUS) ×5 IMPLANT
PAD OB MATERNITY 4.3X12.25 (PERSONAL CARE ITEMS) ×5 IMPLANT
PAD PREP 24X41 OB/GYN DISP (PERSONAL CARE ITEMS) ×5 IMPLANT
PENCIL ELECTRO HAND CTR (MISCELLANEOUS) ×5 IMPLANT
PROBE MONO 100X0.75 ELECT 1.9M (MISCELLANEOUS) ×5 IMPLANT
SCISSORS METZENBAUM CVD 33 (INSTRUMENTS) ×5 IMPLANT
SLEEVE ENDOPATH XCEL 5M (ENDOMECHANICALS) ×15 IMPLANT
SOL PREP PVP 2OZ (MISCELLANEOUS) ×5
SOLUTION ELECTROLUBE (MISCELLANEOUS) ×5 IMPLANT
SOLUTION PREP PVP 2OZ (MISCELLANEOUS) ×3 IMPLANT
SPONGE LAP 18X18 5 PK (GAUZE/BANDAGES/DRESSINGS) ×5 IMPLANT
SURGILUBE 2OZ TUBE FLIPTOP (MISCELLANEOUS) ×5 IMPLANT
SUT ENDO VLOC 180-0-8IN (SUTURE) IMPLANT
SUT MNCRL 4-0 (SUTURE) ×2
SUT MNCRL 4-0 27XMFL (SUTURE) ×3
SUT VIC AB 0 CT1 36 (SUTURE) ×10 IMPLANT
SUT VIC AB 3-0 SH 27 (SUTURE) ×2
SUT VIC AB 3-0 SH 27X BRD (SUTURE) ×3 IMPLANT
SUT VIC AB 4-0 FS2 27 (SUTURE) ×5 IMPLANT
SUTURE MNCRL 4-0 27XMF (SUTURE) ×3 IMPLANT
SYR 50ML LL SCALE MARK (SYRINGE) ×5 IMPLANT
SYRINGE 10CC LL (SYRINGE) ×10 IMPLANT
TROCAR ENDO BLADELESS 11MM (ENDOMECHANICALS) ×5 IMPLANT
TROCAR XCEL NON-BLD 5MMX100MML (ENDOMECHANICALS) ×5 IMPLANT
TUBING CONNECTING 10 (TUBING) ×4 IMPLANT
TUBING CONNECTING 10' (TUBING) ×1
TUBING INSUF HEATED (TUBING) ×5 IMPLANT

## 2017-04-15 NOTE — H&P (View-Only) (Signed)
Preoperative History and Physical  DAKISHA SCHOOF is a 31 y.o. [phone] here for surgical management of heavy  Vaginal bleeding with resultant anemia.   No significant preoperative concerns.  History of Present Illness: 31 y.o. G50P3003 female who presented originally in referral from a hemotology/oncology specialist due to severe menstrual bleeding severe resultant anemia.  Her workup so far has revealed a small uterine fibroid.  Her pap smear was normal and her endometrial biopsy was normal.  She has been counseled regarding the options and elects for definitive surgical management of her bleeding.   Proposed surgery: Total laparoscopic hysterectomy, bilateral salpingectomy, cystoscopy.   Past Medical History:  Diagnosis Date  . Anemia   . Anxiety   . Bipolar disorder ([place])   . Depression, major   . Hypertension    Past Surgical History:  Procedure Laterality Date  . CESAREAN SECTION WITH BILATERAL TUBAL LIGATION     OB History  Gravida Para Term Preterm AB Living  3 3 3     3   SAB TAB Ectopic Multiple Live Births               # Outcome Date GA Lbr Len/2nd Weight Sex Delivery Anes PTL Lv  3 Term           2 Term           1 Term             Patient denies any other pertinent gynecologic issues.   Current Outpatient Prescriptions on File Prior to Visit  Medication Sig Dispense Refill  . hydrochlorothiazide (MICROZIDE) 12.5 MG capsule Take 12.5 mg by mouth daily.    Marland Kitchen ibuprofen (ADVIL,MOTRIN) 200 MG tablet Take 800 mg by mouth every 8 (eight) hours as needed for headache or mild pain.    Marland Kitchen lisinopril (PRINIVIL,ZESTRIL) 20 MG tablet Take 20 mg by mouth daily.    . Multiple Vitamin (MULTIVITAMIN WITH MINERALS) TABS tablet Take 1 tablet by mouth 3 (three) times a week.     No current facility-administered medications on file prior to visit.    Allergies: No Known Allergies  Social History:   reports that she has been smoking Cigarettes.  She has a 3.75 pack-year smoking  history. She has never used smokeless tobacco. She reports that she drinks alcohol. She reports that she does not use drugs.  Family History  Problem Relation Age of Onset  . Cancer Mother 35       leukemia  . Hypertension Mother   . Hypertension Maternal Aunt   . Hypertension Maternal Uncle   . Cancer Paternal Uncle        Prostate  . Diabetes Maternal Grandfather   . Cancer Paternal Grandfather        colon    Review of Systems: Noncontributory  PHYSICAL EXAM: Blood pressure 118/74, height 5\' 1"  (1.549 m), weight 165 lb (74.8 kg). CONSTITUTIONAL: Well-developed, well-nourished female in no acute distress.  HENT:  Normocephalic, atraumatic, External right and left ear normal. Oropharynx is clear and moist EYES: Conjunctivae and EOM are normal. Pupils are equal, round, and reactive to light. No scleral icterus.  NECK: Normal range of motion, supple, no masses SKIN: Skin is warm and dry. No rash noted. Not diaphoretic. No erythema. No pallor. [place]: Alert and oriented to person, place, and time. Normal reflexes, muscle tone coordination. No cranial nerve deficit noted. PSYCHIATRIC: Normal mood and affect. Normal behavior. Normal judgment and thought content. CARDIOVASCULAR: Normal heart rate noted,  regular rhythm RESPIRATORY: Effort and breath sounds normal, no problems with respiration noted ABDOMEN: Soft, nontender, nondistended. PELVIC: Deferred MUSCULOSKELETAL: Normal range of motion. No edema and no tenderness. 2+ distal pulses.  Assessment: Patient Active Problem List   Diagnosis Date Noted  . Fibroid uterus 03/08/2017  . Hypertension 01/18/2017  . Menorrhagia 01/18/2017  . Thalassemia 01/18/2017  . Iron deficiency anemia 01/18/2017    Plan: Patient will undergo surgical management with TLH/BS with cystoscopy.   The risks of surgery were discussed in detail with the patient including but not limited to: bleeding which may require transfusion or reoperation;  infection which may require antibiotics; injury to surrounding organs which may involve bowel, bladder, ureters ; need for additional procedures including laparoscopy or laparotomy; thromboembolic phenomenon, surgical site problems and other postoperative/anesthesia complications. Likelihood of success in alleviating the patient's condition was discussed. Routine postoperative instructions will be reviewed with the patient and her family in detail after surgery.  The patient concurred with the proposed plan, giving informed written consent for the surgery. Preoperative prophylactic antibiotics and SCDs ordered on call to the OR.    Prentice Docker, MD 04/07/2017 9:30 AM

## 2017-04-15 NOTE — Transfer of Care (Signed)
Immediate Anesthesia Transfer of Care Note  Patient: Terri Bruce  Procedure(s) Performed: Procedure(s): LAPAROSCOPIC SUPRACERVICAL HYSTERECTOMY (N/A) LAPAROSCOPIC BILATERAL SALPINGECTOMY (Bilateral) CYSTOSCOPY  Patient Location: PACU  Anesthesia Type:General  Level of Consciousness: awake  Airway & Oxygen Therapy: Patient Spontanous Breathing and Patient connected to face mask oxygen  Post-op Assessment: Report given to RN and Post -op Vital signs reviewed and stable  Post vital signs: Reviewed and stable  Last Vitals:  Vitals:   04/15/17 0855  BP: (!) 162/100  Pulse: 73  Resp: 16  Temp: 37 C    Last Pain:  Vitals:   04/15/17 0855  TempSrc: Tympanic         Complications: No apparent anesthesia complications

## 2017-04-15 NOTE — Anesthesia Procedure Notes (Signed)
Procedure Name: Intubation Date/Time: 04/15/2017 10:01 AM Performed by: Allean Found Pre-anesthesia Checklist: Patient identified, Emergency Drugs available, Suction available, Patient being monitored and Timeout performed Patient Re-evaluated:Patient Re-evaluated prior to induction Oxygen Delivery Method: Circle system utilized Preoxygenation: Pre-oxygenation with 100% oxygen Induction Type: IV induction Ventilation: Mask ventilation without difficulty Laryngoscope Size: Mac and 3 Grade View: Grade III Nasal Tubes: Right Tube size: 7.0 mm Number of attempts: 1 Airway Equipment and Method: Stylet Placement Confirmation: ETT inserted through vocal cords under direct vision and positive ETCO2 Secured at: 22 cm Tube secured with: Tape Dental Injury: Teeth and Oropharynx as per pre-operative assessment

## 2017-04-15 NOTE — Op Note (Addendum)
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 made worse by heavy menses  Procedures:  1) Laparoscopic supracervical hysterectomy 2) Bilateral salpingectomy 3) cystoscopy  Primary Surgeon: Prentice Docker, MD   Assistant Surgeon: Malachy Mood, MD  EBL: 200 mL   IVF: 1,600 mL   Urine output: 350 mL  Specimens: uterus, bilateral fallopian tubes to permanent  Drains: none  Complications: None   Disposition: PACU   Condition: Stable   Findings:  1) globally enlarged uterus  2) posterolateral fibroid (~3cm) 3) dense adhesion of bladder to lower uterine segment and cervix  Procedure Summary:  The patient was taken to the operating room where general anesthesia was administered and found to be adequate. She was placed in the dorsal supine lithotomy position in [place] stirrups and prepped and draped in usual sterile fashion. After a timeout was called, an indwelling catheter was placed in her bladder. A sterile speculum was placed in the vagina and a single-tooth tenaculum was used to grasp the anterior lip of the cervix. A V-Care uterine manipulator was affixed to the uterus in accordance to the manufacturers recommendations. The speculum and tenaculum was removed from the vagina.  Attention was turned to the abdomen where, after injection of local anesthetic, a 5 mm infraumbilical incision was made with the scalpel. Entry into the abdomen was obtained via Optiview trocar technique (a blunt entry technique with camera visualization through the obturator upon entry). Verification of entry into the abdomen was obtained using opening pressures. The abdomen was insufflated with CO2. The camera was introduced through the trocar with verification of atraumatic  entry. A left lower quadrant 5 mm port was created via direct intra-abdominal camera visualization without difficulty. An 11 mm right lower quadrant port was placed in a similar fashion without difficulty.  After inspection of the abdomen and pelvis with the above-noted findings, decision was made to create a 31mm camera port several inches above the umbilical port due to poor visualization given the size of her uterus.  This was created under direct intra-abdominal camera visualization without difficulty.  Tthe bilateral ureters were identified and found to be well away from the operative area of interest. The right fallopian tube was grasped at the fimbriated end and was transected using the LigaSure along the mesosalpinx in a lateral to medial fashion. The LigaSure then was used to transect the right round ligament and the utero-ovarian ligament was transected. Tissue was divided along the right broad ligament to the level of the interior cervical os. Adhesions from the anterior uterus to the bladder were taken down with great care. However, after back-filling the bladder with methyline blue, decision was made not to attempt dissecting bladder from cervix.  The right uterine artery was skeletonized and identified and after ligation was transected with the LigaSure device. The same procedure was carried out on the left side. The uterus was divided from the cervix using monopolar electrocautery in a circumferential fashion following the uterine manipulator.  The uterine manipulator was removed from the body cavity and vagina.  The cervical stump was inspected and cauterized to obtain hemostasis.    In order to remove the uterus and fallopian tubes, a mini-Pfannenstiel laparotomy was performed along her prior surgical scar line.  The was about 6 cm in width.  The uterus and fallopian tubes were removed through the the mini-laparotomy using a cold-morcellation technique with the scalpel above the level of the  fascia.     The mini-laparotomy incision was closed using 0-vicryl at the level of the fascia, 3-0 vicryl was used to re-approximate the subcutaneous tissue and decrease tension on the skin closure. A 4-0 vicryl was used to close the skin in a subcuticular fashion.  The abdomen as insuflated again and all vascular pedicles were inspected and found to be hemostatic, including the cervical stump.  The monopolar electrocautery was utilized to cauterize the proximal endocervical canal.  The right lower quadrant trocar was removed and the fascia was reapproximated using #0 Vicryl with a single stitch. The abdomen was then desufflated of CO2 after removal of all instruments. Five deep breaths were given by anesthesia to the patient to help with removal of CO2 from the abdomen. The right lower quadrant skin incision was closed using 4-0 Vicryl in a subcuticular fashion. The remaining skin incisions were closed using surgical skin glue and a layer of surgical skin glue was placed over the right lower quadrant skin incision, as well.   Cystoscopy was undertaken at this point. The Foley catheter was removed and the 70 cystoscope was gently introduced through the urethra. The bladder survey was undertaken with efflux of urine from both orifices noted. There were no defects noted in the bladder wall. The cystoscope was removed and the Foley catheter was used to fully drain the bladder, then removed.   The patient tolerated the procedure well.  Sponge, lap, needle, and instrument counts were correct x 2.  VTE prophylaxis: SCDs. Antibiotic prophylaxis: Ancef 2 grams IV on call to the OR. She was awakened in the operating room and was taken to the PACU in stable condition.   Prentice Docker, MD 04/15/2017 1:18 PM

## 2017-04-15 NOTE — Anesthesia Post-op Follow-up Note (Cosign Needed)
Anesthesia QCDR form completed.        

## 2017-04-15 NOTE — Anesthesia Preprocedure Evaluation (Signed)
Anesthesia Evaluation  Patient identified by MRN, date of birth, ID band Patient awake    Reviewed: Allergy & Precautions, NPO status , Patient's Chart, lab work & pertinent test results  Airway Mallampati: II       Dental  (+) Teeth Intact   Pulmonary COPD, Current Smoker,     + decreased breath sounds      Cardiovascular Exercise Tolerance: Good hypertension, Pt. on medications  Rhythm:Regular     Neuro/Psych Anxiety Depression Bipolar Disorder negative neurological ROS     GI/Hepatic negative GI ROS, Neg liver ROS,   Endo/Other  negative endocrine ROS  Renal/GU negative Renal ROS     Musculoskeletal   Abdominal Normal abdominal exam  (+)   Peds  Hematology  (+) anemia ,   Anesthesia Other Findings   Reproductive/Obstetrics                             Anesthesia Physical Anesthesia Plan  ASA: II  Anesthesia Plan: General   Post-op Pain Management:    Induction: Intravenous  PONV Risk Score and Plan: 0 and Ondansetron  Airway Management Planned: Oral ETT  Additional Equipment:   Intra-op Plan:   Post-operative Plan: Extubation in OR  Informed Consent: I have reviewed the patients History and Physical, chart, labs and discussed the procedure including the risks, benefits and alternatives for the proposed anesthesia with the patient or authorized representative who has indicated his/her understanding and acceptance.     Plan Discussed with: CRNA  Anesthesia Plan Comments:         Anesthesia Quick Evaluation

## 2017-04-15 NOTE — OR Nursing (Signed)
Lab tech in for abo/rh, cbc, and cmp draw

## 2017-04-15 NOTE — Discharge Instructions (Addendum)
Supracervical Hysterectomy, Care After Refer to this sheet in the next few weeks. These instructions provide you with information on caring for yourself after your procedure. Your health care provider may also give you more specific instructions. Your treatment has been planned according to current medical practices, but problems sometimes occur. Call your health care provider if you have any problems or questions after your procedure. What can I expect after the procedure? After your procedure, it is typical to have some discomfort, tenderness, swelling, and bruising at the surgical sites. This normally lasts for about 2 weeks. Follow these instructions at home:  Get plenty of rest and sleep.  Only take over-the-counter or prescription medicines as directed by your health care provider.  Do not take aspirin. It can cause bleeding.  Do not drive until your health care provider approves.  Follow your health care provider's advice regarding exercise, lifting, and general activities.  Resume your usual diet as directed by your health care provider.  Do not douche, use tampons, or have sexual intercourse for at least 6 weeks or until your health care provider gives you permission.  Change your bandages (dressings) only as directed by your health care provider.  Monitor your temperature.  Take showers instead of baths for 2-3 weeks or as directed by your health care provider.  Drink enough fluids to keep your urine clear or pale yellow.  Do not drink alcohol until your health care provider gives you permission.  If you are constipated, you may take a mild laxative if your health care provider approves. Bran foods may also help with constipation problems.  Try to have someone home with you for 1-2 weeks to help with activities.  Follow up with your health care provider as directed. Contact a health care provider if:  You have swelling, redness, or increasing pain in the incision  area.  You have pus coming from an incision.  You notice a bad smell coming from the incision or dressing.  You have swelling, redness, or pain in the area around the IV site.  Your incision breaks open.  You feel dizzy or lightheaded.  You have pain or bleeding when you urinate.  You have persistent diarrhea.  You have persistent nausea and vomiting.  You have abnormal vaginal discharge.  You have a rash.  Your pain is not controlled with your prescribed medicine. Get help right away if:  You have a fever.  You have severe abdominal pain.  You have chest pain.  You have shortness of breath.  You faint.  You have pain, swelling, or redness in your leg.  You have heavy vaginal bleeding with blood clots. This information is not intended to replace advice given to you by your health care provider. Make sure you discuss any questions you have with your health care provider. Document Released: 06/21/2013 Document Revised: 02/06/2016 Document Reviewed: 03/03/2013 Elsevier Interactive Patient Education  2018 [place]   1) The drugs that you were given will stay in your system until tomorrow so for the next 24 hours you should not:  A) Drive an automobile B) Make any legal decisions C) Drink any alcoholic beverage   2) You may resume regular meals tomorrow.  Today it is better to start with liquids and gradually work up to solid foods.  You may eat anything you prefer, but it is better to start with liquids, then soup and crackers, and gradually work up to solid foods.  3) Please notify your doctor immediately if you have any unusual bleeding, trouble breathing, redness and pain at the surgery site, drainage, fever, or pain not relieved by medication.

## 2017-04-15 NOTE — Interval H&P Note (Signed)
History and Physical Interval Note:  04/15/2017 9:23 AM  Terri Bruce  has presented today for surgery, with the diagnosis of [name]  The various methods of treatment have been discussed with the patient and family. After consideration of risks, benefits and other options for treatment, the patient has consented to  Procedure(s): HYSTERECTOMY TOTAL LAPAROSCOPIC BILATERAL SALPINGECTOMY (Bilateral) Cystoscopy as a surgical intervention .  The patient's history has been reviewed, patient examined, no change in status, stable for surgery.  I have reviewed the patient's chart and labs.  Questions were answered to the patient's satisfaction.     Prentice Docker, MD 04/15/2017 9:24 AM

## 2017-04-15 NOTE — OR Nursing (Signed)
Per A Helton, RN, do not need lab results for cbc and cmp per Dr. Glennon Mac prior to OR transport

## 2017-04-16 ENCOUNTER — Encounter: Payer: Self-pay | Admitting: Obstetrics and Gynecology

## 2017-04-16 ENCOUNTER — Telehealth: Payer: Self-pay

## 2017-04-16 LAB — SURGICAL PATHOLOGY

## 2017-04-16 NOTE — Anesthesia Postprocedure Evaluation (Signed)
Anesthesia Post Note  Patient: Terri Bruce  Procedure(s) Performed: Procedure(s) (LRB): LAPAROSCOPIC SUPRACERVICAL HYSTERECTOMY (N/A) LAPAROSCOPIC BILATERAL SALPINGECTOMY (Bilateral) CYSTOSCOPY  Patient location during evaluation: PACU Anesthesia Type: General Level of consciousness: awake Pain management: pain level controlled Vital Signs Assessment: post-procedure vital signs reviewed and stable Respiratory status: spontaneous breathing Cardiovascular status: stable Anesthetic complications: no     Last Vitals:  Vitals:   04/15/17 1544 04/15/17 1612  BP: 119/75 112/81  Pulse: 73 70  Resp: 12 14  Temp:      Last Pain:  Vitals:   04/15/17 1612  TempSrc:   PainSc: 4                  VAN STAVEREN,Madysin Crisp

## 2017-04-16 NOTE — Telephone Encounter (Signed)
Called pt today to check on her after hysterectomy yesterday with SDJ. Pt has had no complications, feeling okay, urinating as normal with no problems, no fever or chills, has eaten some soup and crackers. Taking pain medication. Coming in Tuesday for incision check. Pt will call me if she has any problems with anything before appt next week.

## 2017-04-20 ENCOUNTER — Ambulatory Visit (INDEPENDENT_AMBULATORY_CARE_PROVIDER_SITE_OTHER): Payer: Medicaid Other | Admitting: Obstetrics and Gynecology

## 2017-04-20 VITALS — BP 118/78 | Wt 160.0 lb

## 2017-04-20 DIAGNOSIS — Z09 Encounter for follow-up examination after completed treatment for conditions other than malignant neoplasm: Secondary | ICD-10-CM

## 2017-04-20 NOTE — Progress Notes (Signed)
   Postoperative Follow-up Patient presents post op from laparoscopic supracervical hysterectomy 5days ago for abnormal uterine bleeding.  Subjective: Patient reports improvement in her preop symptoms. Eating a regular diet without difficulty. Pain is controlled with current analgesics. Medications being used: ibuprofen (OTC) and narcotic analgesics including percocet.  Activity: increasing slowly without difficulty. Denies fevers, chills, nausea, vomiting. Denies issues with her incisions  Objective: Vitals:   04/20/17 1114  BP: 118/78   Vital Signs: BP 118/78   Wt 160 lb (72.6 kg)   LMP 03/22/2017   BMI 30.23 kg/m  Constitutional: Well nourished, well developed female in no acute distress.  HEENT: normal Skin: Warm and dry.  Extremity: no edema  Abdomen: soft, NT, mildly distended, +BS,  all clean, dry, intact without erythema, induration, and warmth.   Assessment: 31 y.o. s/p LSH/BS progressing well  Plan: Patient has done well after surgery with no apparent complications.  I have discussed the post-operative course to date, and the expected progress moving forward.  The patient understands what complications to be concerned about.  I will see the patient in routine follow up, or sooner if needed.    Activity plan: increase activity slowly. Wound care instructions given. Pathology reviewed.  Prentice Docker, MD 04/20/2017, 11:20 AM

## 2017-05-10 ENCOUNTER — Inpatient Hospital Stay: Payer: Medicaid Other

## 2017-05-10 ENCOUNTER — Inpatient Hospital Stay: Payer: Medicaid Other | Admitting: Oncology

## 2017-05-24 ENCOUNTER — Encounter: Payer: Self-pay | Admitting: Oncology

## 2017-05-24 ENCOUNTER — Inpatient Hospital Stay: Payer: Medicaid Other

## 2017-05-24 ENCOUNTER — Inpatient Hospital Stay: Payer: Medicaid Other | Attending: Oncology | Admitting: Oncology

## 2017-05-24 VITALS — BP 201/147 | HR 75 | Temp 97.4°F | Resp 18 | Wt 166.1 lb

## 2017-05-24 DIAGNOSIS — D509 Iron deficiency anemia, unspecified: Secondary | ICD-10-CM

## 2017-05-24 DIAGNOSIS — D500 Iron deficiency anemia secondary to blood loss (chronic): Secondary | ICD-10-CM

## 2017-05-24 DIAGNOSIS — N920 Excessive and frequent menstruation with regular cycle: Secondary | ICD-10-CM

## 2017-05-24 DIAGNOSIS — Z806 Family history of leukemia: Secondary | ICD-10-CM

## 2017-05-24 DIAGNOSIS — Z9889 Other specified postprocedural states: Secondary | ICD-10-CM | POA: Diagnosis not present

## 2017-05-24 DIAGNOSIS — F319 Bipolar disorder, unspecified: Secondary | ICD-10-CM

## 2017-05-24 DIAGNOSIS — D563 Thalassemia minor: Secondary | ICD-10-CM | POA: Diagnosis not present

## 2017-05-24 DIAGNOSIS — R51 Headache: Secondary | ICD-10-CM

## 2017-05-24 DIAGNOSIS — F17210 Nicotine dependence, cigarettes, uncomplicated: Secondary | ICD-10-CM | POA: Diagnosis not present

## 2017-05-24 DIAGNOSIS — Z79899 Other long term (current) drug therapy: Secondary | ICD-10-CM

## 2017-05-24 DIAGNOSIS — D259 Leiomyoma of uterus, unspecified: Secondary | ICD-10-CM | POA: Diagnosis not present

## 2017-05-24 DIAGNOSIS — Z8042 Family history of malignant neoplasm of prostate: Secondary | ICD-10-CM | POA: Diagnosis not present

## 2017-05-24 DIAGNOSIS — Z90710 Acquired absence of both cervix and uterus: Secondary | ICD-10-CM

## 2017-05-24 DIAGNOSIS — I10 Essential (primary) hypertension: Secondary | ICD-10-CM | POA: Diagnosis not present

## 2017-05-24 LAB — CBC
HCT: 37.5 % (ref 35.0–47.0)
Hemoglobin: 11.7 g/dL — ABNORMAL LOW (ref 12.0–16.0)
MCH: 21 pg — ABNORMAL LOW (ref 26.0–34.0)
MCHC: 31.3 g/dL — AB (ref 32.0–36.0)
MCV: 67 fL — AB (ref 80.0–100.0)
Platelets: 177 10*3/uL (ref 150–440)
RBC: 5.6 MIL/uL — ABNORMAL HIGH (ref 3.80–5.20)
RDW: 15.6 % — AB (ref 11.5–14.5)
WBC: 6.2 10*3/uL (ref 3.6–11.0)

## 2017-05-24 LAB — IRON AND TIBC
IRON: 31 ug/dL (ref 28–170)
Saturation Ratios: 7 % — ABNORMAL LOW (ref 10.4–31.8)
TIBC: 457 ug/dL — ABNORMAL HIGH (ref 250–450)
UIBC: 426 ug/dL

## 2017-05-24 LAB — FERRITIN: FERRITIN: 6 ng/mL — AB (ref 11–307)

## 2017-05-24 NOTE — Progress Notes (Signed)
Here for follow up. Pt BP noted to be elevated 210/147 the patient stated she forgot to take anti-htn meds this am .denies symptoms  At this time. Will repeat.    Repeat bp @ 1040 -190/126. Dr Janese Banks informed of BP elevation.  Repeat BP @ 1055  L arm 186/150  R arm 173/140  p 83.. Pt denies all symptoms. Encouraged to go to urgent care. Also informed her w headache,light headedness,tingling or other symptoms of concern-go to ER. Pt acknowledged understanding. Dr Janese Banks aware of repeat VS.

## 2017-05-24 NOTE — Progress Notes (Signed)
Hematology/Oncology Consult note [facility]  [phone] [phone]  Patient Care Team: Remi Haggard, FNP as PCP - General (Family Medicine)   Name of the patient: Terri Bruce  [id number]  10-16-1985   Date of visit: 05/24/17  Diagnosis- iron deficiency anemia likely due to menorrhagia  Chief complaint/ Reason for visit- f/u of iron deficiency anemia  Heme/Onc history: patient is a 31 year old African-American female with a history of beta thalassemia trait. She has had 3 prior pregnancy in the last pregnancy was in 2016. She has had tubal ligation done since then. She has been referred to [place] for iron deficiency anemia. CBC from April 2017 showed white count of 4.6, H&H of 10.8/35.9 with the MCV is 69.2 and a platelet count of 219. CMP at that time was within normal limits. Most recent CBC from 12/30/2016 revealed white count of 4.7, H&H of 7.4/28 with MCV of 59 and a platelet count of 182. Patient reports having heavy menstrual cycles which last for 8 days. The first 4 days are particularly heavy. She also reports having history of fibroids. She has tried birth control in the past and it did not help. She also reports having daily headaches and takes one tablet of ibuprofen on a daily basis. He reports it fatigue. Denies other complaints. Denies any gum bleeds, nosebleeds, blood in sputum stool or urine. She has not had any significant bleeding complications with prior surgeries.  CBC on 01/18/2017 showed white count of 4.7, H&H of 7.5/26 with an MCV of 54.8 and a platelet count of 250. Ferritin was low at 3. B12 was normal at 566. Iron studies showed an elevated TIBC of 569 and a low iron saturation of 3%. Folate was normal and reticulocyte count was low normal at 1.3%.  Patient received 2 doses of feraheme in may 2018. She also underwent hysterectomy in August 2018.  Interval history- reports doing well. Denies any complaints. She has  now been using ibuprofen occasionally but not on a consistent basis. Denies any blood in stool or urine.   ECOG PS- 0 Pain scale- 0   Review of systems- Review of Systems  Constitutional: Negative for chills, fever, malaise/fatigue and weight loss.  HENT: Negative for congestion, ear discharge and nosebleeds.   Eyes: Negative for blurred vision.  Respiratory: Negative for cough, hemoptysis, sputum production, shortness of breath and wheezing.   Cardiovascular: Negative for chest pain, palpitations, orthopnea and claudication.  Gastrointestinal: Negative for abdominal pain, blood in stool, constipation, diarrhea, heartburn, melena, nausea and vomiting.  Genitourinary: Negative for dysuria, flank pain, frequency, hematuria and urgency.  Musculoskeletal: Negative for back pain, joint pain and myalgias.  Skin: Negative for rash.  Neurological: Negative for dizziness, tingling, focal weakness, seizures, weakness and headaches.  Endo/Heme/Allergies: Does not bruise/bleed easily.  Psychiatric/Behavioral: Negative for depression and suicidal ideas. The patient does not have insomnia.       No Known Allergies   Past Medical History:  Diagnosis Date  . Anemia   . Anxiety   . Bipolar disorder ([place])   . Depression, major   . Heavy menstrual bleeding   . Hypertension      Past Surgical History:  Procedure Laterality Date  . CESAREAN SECTION WITH BILATERAL TUBAL LIGATION    . CYSTOSCOPY  04/15/2017   Procedure: CYSTOSCOPY;  Surgeon: Will Bonnet, MD;  Location: [facility] ORS;  Service: Gynecology;;  . LAPAROSCOPIC BILATERAL SALPINGECTOMY Bilateral 04/15/2017   Procedure: LAPAROSCOPIC BILATERAL SALPINGECTOMY;  Surgeon: Will Bonnet, MD;  Location: [facility] ORS;  Service: Gynecology;  Laterality: Bilateral;  . LAPAROSCOPIC SUPRACERVICAL HYSTERECTOMY N/A 04/15/2017   Procedure: LAPAROSCOPIC SUPRACERVICAL HYSTERECTOMY;  Surgeon: Will Bonnet, MD;  Location: [facility] ORS;  Service:  Gynecology;  Laterality: N/A;    Social History   Social History  . Marital status: Single    Spouse name: N/A  . Number of children: N/A  . Years of education: N/A   Occupational History  . Not on file.   Social History Main Topics  . Smoking status: Current Every Day Smoker    Packs/day: 0.25    Years: 15.00    Types: Cigarettes  . Smokeless tobacco: Never Used  . Alcohol use 2.4 oz/week    4 Cans of beer per week  . Drug use: No  . Sexual activity: Yes    Birth control/ protection: Surgical   Other Topics Concern  . Not on file   Social History Narrative  . No narrative on file    Family History  Problem Relation Age of Onset  . Cancer Mother 34       leukemia  . Hypertension Mother   . Hypertension Maternal Aunt   . Hypertension Maternal Uncle   . Cancer Paternal Uncle        Prostate  . Diabetes Maternal Grandfather   . Cancer Paternal Grandfather        colon     Current Outpatient Prescriptions:  .  hydrochlorothiazide (MICROZIDE) 12.5 MG capsule, Take 12.5 mg by mouth daily., Disp: , Rfl:  .  ibuprofen (ADVIL,MOTRIN) 600 MG tablet, Take 1 tablet (600 mg total) by mouth every 6 (six) hours as needed for mild pain or cramping., Disp: 30 tablet, Rfl: 0 .  lisinopril (PRINIVIL,ZESTRIL) 20 MG tablet, Take 20 mg by mouth daily., Disp: , Rfl:  .  Multiple Vitamin (MULTIVITAMIN WITH MINERALS) TABS tablet, Take 1 tablet by mouth 3 (three) times a week., Disp: , Rfl:  .  ondansetron (ZOFRAN ODT) 8 MG disintegrating tablet, Take 1 tablet (8 mg total) by mouth every 8 (eight) hours as needed for nausea or vomiting., Disp: 20 tablet, Rfl: 0 .  oxyCODONE-acetaminophen (ROXICET) 5-325 MG tablet, Take 2 tablets by mouth every 6 (six) hours as needed for severe pain (breakthrough pain)., Disp: 30 tablet, Rfl: 0  Physical exam:  Vitals:   05/24/17 1025  BP: (!) 201/147  Pulse: 75  Resp: 18  Temp: (!) 97.4 F (36.3 C)  TempSrc: Tympanic  Weight: 166 lb 1.9 oz  (75.4 kg)   Physical Exam  Constitutional: She is oriented to person, place, and time and well-developed, well-nourished, and in no distress.  HENT:  Head: Normocephalic and atraumatic.  Eyes: Pupils are equal, round, and reactive to light. EOM are normal.  Neck: Normal range of motion.  Cardiovascular: Normal rate, regular rhythm and normal heart sounds.   Pulmonary/Chest: Effort normal and breath sounds normal.  Abdominal: Soft. Bowel sounds are normal.  Neurological: She is alert and oriented to person, place, and time.  Skin: Skin is warm and dry.     CMP Latest Ref Rng & Units 04/15/2017  Glucose 65 - 99 mg/dL 98  BUN 6 - 20 mg/dL 14  Creatinine 0.44 - 1.00 mg/dL 0.79  Sodium 135 - 145 mmol/L 138  Potassium 3.5 - 5.1 mmol/L 3.7  Chloride 101 - 111 mmol/L 108  CO2 22 - 32 mmol/L 24  Calcium 8.9 - 10.3 mg/dL 8.9  Total Protein 6.5 - 8.1 g/dL 7.6  Total Bilirubin 0.3 - 1.2 mg/dL 0.6  Alkaline Phos 38 - 126 U/L 51  AST 15 - 41 U/L 19  ALT 14 - 54 U/L 21   CBC Latest Ref Rng & Units 05/24/2017  WBC 3.6 - 11.0 K/uL 6.2  Hemoglobin 12.0 - 16.0 g/dL 11.7(L)  Hematocrit 35.0 - 47.0 % 37.5  Platelets 150 - 440 K/uL 177     Assessment and plan- Patient is a 31 y.o. female referred to [place] for iron deficiency anemia likely due to menorrhagia with recent hysterectomy  1. Iron deficiency anemia- significnatly improved after 2 doses of IV iron. This is likely due to menorrhagia which has now been resolved due to hysterectomy. She does have mild anemia likely due to beta thalassemia trait. Iron studies from today are pending. If they are persistently low, I will consider giving her more IV iron. Otherwise repeat cbc, ferritin and iron studies in 3 months and 6 months and I will see ehr in 6 months  2. Microcytosis- disproportionately low as compared to H/H due to beta thalessmis trait. Continue to monitor  3. Uncontrolled HTN- Patient did not take her morning BP meds. She denies any  vsiion changes, chest pain or weakness/ numbness. It continued to be high after repeat checks. I therefore discussed risks of uncontrolled hTN including all but not limited to MI and stroke and recommended that she goes to Urgent care for further evaluation. Patient understands but does not wish to goto urgent care. She would like to go home and take her BP meds     Visit Diagnosis 1. Iron deficiency anemia due to chronic blood loss   2. Uncontrolled hypertension   3. Beta thalassemia trait   4. Microcytic anemia      Dr. Randa Evens, MD, MPH [facility] Pager- [phone] 05/24/2017 11:18 AM

## 2017-05-25 NOTE — Addendum Note (Signed)
Addended by: Randa Evens C on: 05/25/2017 08:10 AM   Modules accepted: Orders

## 2017-05-26 ENCOUNTER — Encounter: Payer: Self-pay | Admitting: Obstetrics and Gynecology

## 2017-05-26 ENCOUNTER — Ambulatory Visit (INDEPENDENT_AMBULATORY_CARE_PROVIDER_SITE_OTHER): Payer: Medicaid Other | Admitting: Obstetrics and Gynecology

## 2017-05-26 VITALS — BP 142/102 | HR 84 | Wt 166.0 lb

## 2017-05-26 DIAGNOSIS — Z09 Encounter for follow-up examination after completed treatment for conditions other than malignant neoplasm: Secondary | ICD-10-CM

## 2017-05-26 NOTE — Progress Notes (Signed)
   Postoperative Follow-up Patient presents post op from LSH/BS 6weeks ago for abnormal uterine bleeding, fibroid uterus, anemia.  Subjective: Patient reports marked improvement in her preop symptoms. Eating a regular diet without difficulty. The patient is not having any pain.  Activity: normal activities of daily living.  Objective: Vitals:   05/26/17 1027  BP: (!) 142/102  Pulse: 84   Vital Signs: BP (!) 142/102 (BP Location: Left Arm, Patient Position: Sitting, Cuff Size: Normal)   Pulse 84   Wt 166 lb (75.3 kg)   LMP 03/22/2017   BMI 31.37 kg/m  Constitutional: Well nourished, well developed female in no acute distress.  HEENT: normal Skin: Warm and dry.  Extremity: no edema  Abdomen: Soft, non-tender, normal bowel sounds; no bruits, organomegaly or masses. clean, dry, intact and no erythema, induration, warmth, and tenderness at any incision site  Pelvic exam: (female chaperone present) is not limited by body habitus EGBUS: within normal limits Vagina: within normal limits and with no blood in the vault Cervix: normal appearing external os, no bleeding, no lesions, nontender    Assessment: 31 y.o. s/p LSH/BS progressing well  Plan: Patient has done well after surgery with no apparent complications.  I have discussed the post-operative course to date, and the expected progress moving forward.  The patient understands what complications to be concerned about.  I will see the patient in routine follow up, or sooner if needed.    Activity plan: No restriction.  Prentice Docker, MD 05/26/2017, 10:40 AM

## 2017-05-31 ENCOUNTER — Telehealth: Payer: Self-pay | Admitting: *Deleted

## 2017-05-31 NOTE — Telephone Encounter (Signed)
Called and left message with pt that her ferritin was low and Dr. Janese Banks wanted to have her get 2 feraheme doses one week apart and if she can call me and let me know if she would want to do it , we can schedule it. Left my direct call back number.

## 2017-05-31 NOTE — Telephone Encounter (Signed)
-----   Message from Sindy Guadeloupe, MD sent at 05/25/2017  8:09 AM EDT ----- Please schedule her for feraheme next week. I will put in the treatment plan. She is still iron deficient

## 2017-06-10 ENCOUNTER — Telehealth: Payer: Self-pay | Admitting: *Deleted

## 2017-06-10 NOTE — Telephone Encounter (Signed)
Called and left message that I had called last week to check and see if pt wants some feraheme but never heard back from pt. Leaving a message that ferritin level was low and pt can have feraheme if she wants. Asked her to call me back either way.

## 2017-08-23 ENCOUNTER — Other Ambulatory Visit: Payer: Medicaid Other

## 2017-08-26 ENCOUNTER — Other Ambulatory Visit: Payer: Medicaid Other

## 2017-09-02 ENCOUNTER — Inpatient Hospital Stay: Payer: Medicaid Other | Attending: Oncology

## 2017-09-02 DIAGNOSIS — I10 Essential (primary) hypertension: Secondary | ICD-10-CM | POA: Diagnosis not present

## 2017-09-02 DIAGNOSIS — Z8042 Family history of malignant neoplasm of prostate: Secondary | ICD-10-CM | POA: Diagnosis not present

## 2017-09-02 DIAGNOSIS — Z806 Family history of leukemia: Secondary | ICD-10-CM | POA: Diagnosis not present

## 2017-09-02 DIAGNOSIS — R51 Headache: Secondary | ICD-10-CM | POA: Diagnosis not present

## 2017-09-02 DIAGNOSIS — Z9889 Other specified postprocedural states: Secondary | ICD-10-CM | POA: Insufficient documentation

## 2017-09-02 DIAGNOSIS — D259 Leiomyoma of uterus, unspecified: Secondary | ICD-10-CM | POA: Insufficient documentation

## 2017-09-02 DIAGNOSIS — N920 Excessive and frequent menstruation with regular cycle: Secondary | ICD-10-CM | POA: Insufficient documentation

## 2017-09-02 DIAGNOSIS — D563 Thalassemia minor: Secondary | ICD-10-CM | POA: Diagnosis not present

## 2017-09-02 DIAGNOSIS — D509 Iron deficiency anemia, unspecified: Secondary | ICD-10-CM | POA: Diagnosis present

## 2017-09-02 DIAGNOSIS — D500 Iron deficiency anemia secondary to blood loss (chronic): Secondary | ICD-10-CM

## 2017-09-02 DIAGNOSIS — Z79899 Other long term (current) drug therapy: Secondary | ICD-10-CM | POA: Diagnosis not present

## 2017-09-02 DIAGNOSIS — F17210 Nicotine dependence, cigarettes, uncomplicated: Secondary | ICD-10-CM | POA: Diagnosis not present

## 2017-09-02 DIAGNOSIS — Z90710 Acquired absence of both cervix and uterus: Secondary | ICD-10-CM | POA: Diagnosis not present

## 2017-09-02 DIAGNOSIS — F319 Bipolar disorder, unspecified: Secondary | ICD-10-CM | POA: Diagnosis not present

## 2017-09-02 LAB — CBC WITH DIFFERENTIAL/PLATELET
BASOS ABS: 0 10*3/uL (ref 0–0.1)
BASOS PCT: 1 %
EOS ABS: 0.1 10*3/uL (ref 0–0.7)
Eosinophils Relative: 3 %
HCT: 41 % (ref 35.0–47.0)
HEMOGLOBIN: 13.1 g/dL (ref 12.0–16.0)
Lymphocytes Relative: 37 %
Lymphs Abs: 1.7 10*3/uL (ref 1.0–3.6)
MCH: 22.8 pg — ABNORMAL LOW (ref 26.0–34.0)
MCHC: 31.9 g/dL — AB (ref 32.0–36.0)
MCV: 71.6 fL — ABNORMAL LOW (ref 80.0–100.0)
Monocytes Absolute: 0.2 10*3/uL (ref 0.2–0.9)
Monocytes Relative: 5 %
NEUTROS PCT: 54 %
Neutro Abs: 2.6 10*3/uL (ref 1.4–6.5)
Platelets: 169 10*3/uL (ref 150–440)
RBC: 5.73 MIL/uL — AB (ref 3.80–5.20)
RDW: 14.9 % — ABNORMAL HIGH (ref 11.5–14.5)
WBC: 4.8 10*3/uL (ref 3.6–11.0)

## 2017-09-02 LAB — FERRITIN: Ferritin: 8 ng/mL — ABNORMAL LOW (ref 11–307)

## 2017-09-02 LAB — IRON AND TIBC
Iron: 36 ug/dL (ref 28–170)
SATURATION RATIOS: 8 % — AB (ref 10.4–31.8)
TIBC: 469 ug/dL — AB (ref 250–450)
UIBC: 433 ug/dL

## 2017-09-03 ENCOUNTER — Telehealth: Payer: Self-pay | Admitting: *Deleted

## 2017-09-03 NOTE — Telephone Encounter (Signed)
Left VM message for patient to call me back about her lab results (low ferritin) and getting two more doses of IV Feraheme, per Dr. Elroy Channel request.        dhs

## 2017-09-09 ENCOUNTER — Telehealth: Payer: Self-pay | Admitting: *Deleted

## 2017-09-09 NOTE — Telephone Encounter (Signed)
Call patient, left 2nd VM message re: low ferritin and getting 2 more doses of IV Feraheme, per Dr. Elroy Channel request.      dhs

## 2017-09-21 ENCOUNTER — Telehealth: Payer: Self-pay | Admitting: *Deleted

## 2017-09-21 NOTE — Telephone Encounter (Signed)
Left message on VM for patient to call me back re: low iron and getting 2 more doses of feraheme.     dhs

## 2017-09-29 ENCOUNTER — Telehealth: Payer: Self-pay | Admitting: *Deleted

## 2017-09-29 NOTE — Telephone Encounter (Signed)
Left another VM message for patient to call us back about getting 2 doses of IV feraheme.        dhs

## 2017-11-22 ENCOUNTER — Inpatient Hospital Stay: Payer: Medicaid Other | Admitting: Oncology

## 2017-11-22 ENCOUNTER — Inpatient Hospital Stay: Payer: Medicaid Other | Attending: Oncology

## 2018-12-22 ENCOUNTER — Inpatient Hospital Stay: Payer: Medicaid Other | Admitting: Hematology and Oncology

## 2023-12-29 ENCOUNTER — Encounter: Payer: Self-pay | Admitting: Oncology
